# Patient Record
Sex: Female | Born: 1965 | Race: White | Hispanic: No | Marital: Married | Smoking: Never smoker
Health system: Southern US, Community
[De-identification: ages and names within clinical notes are randomized; demographics above are authoritative.]

## PROBLEM LIST (undated history)

## (undated) DIAGNOSIS — I10 Essential (primary) hypertension: Secondary | ICD-10-CM

## (undated) DIAGNOSIS — E785 Hyperlipidemia, unspecified: Secondary | ICD-10-CM

## (undated) DIAGNOSIS — E119 Type 2 diabetes mellitus without complications: Secondary | ICD-10-CM

## (undated) HISTORY — PX: BREAST CYST ASPIRATION: SHX578

---

## 1999-07-29 HISTORY — PX: BREAST EXCISIONAL BIOPSY: SUR124

## 2004-07-24 ENCOUNTER — Ambulatory Visit: Payer: Self-pay | Admitting: Obstetrics and Gynecology

## 2005-01-03 ENCOUNTER — Ambulatory Visit: Payer: Self-pay | Admitting: Obstetrics and Gynecology

## 2006-02-24 ENCOUNTER — Ambulatory Visit: Payer: Self-pay | Admitting: Obstetrics and Gynecology

## 2007-02-11 ENCOUNTER — Ambulatory Visit: Payer: Self-pay | Admitting: Obstetrics and Gynecology

## 2007-07-21 ENCOUNTER — Ambulatory Visit: Payer: Self-pay | Admitting: Gastroenterology

## 2007-08-04 ENCOUNTER — Other Ambulatory Visit: Payer: Self-pay

## 2007-08-04 ENCOUNTER — Ambulatory Visit: Payer: Self-pay | Admitting: General Surgery

## 2007-08-17 ENCOUNTER — Ambulatory Visit: Payer: Self-pay | Admitting: Obstetrics and Gynecology

## 2007-08-23 ENCOUNTER — Inpatient Hospital Stay: Payer: Self-pay | Admitting: Obstetrics and Gynecology

## 2008-05-18 ENCOUNTER — Ambulatory Visit: Payer: Self-pay | Admitting: Obstetrics and Gynecology

## 2009-07-16 ENCOUNTER — Ambulatory Visit: Payer: Self-pay | Admitting: Obstetrics and Gynecology

## 2010-02-04 ENCOUNTER — Inpatient Hospital Stay: Payer: Self-pay | Admitting: General Surgery

## 2010-02-05 LAB — PATHOLOGY REPORT

## 2010-08-08 ENCOUNTER — Ambulatory Visit: Payer: Self-pay | Admitting: Obstetrics and Gynecology

## 2011-08-25 ENCOUNTER — Ambulatory Visit: Payer: Self-pay | Admitting: Specialist

## 2011-08-28 ENCOUNTER — Ambulatory Visit: Payer: Self-pay | Admitting: Obstetrics and Gynecology

## 2012-03-30 ENCOUNTER — Ambulatory Visit: Payer: Self-pay | Admitting: Surgery

## 2012-08-31 ENCOUNTER — Ambulatory Visit: Payer: Self-pay | Admitting: Obstetrics and Gynecology

## 2012-09-17 ENCOUNTER — Ambulatory Visit: Payer: Self-pay | Admitting: Neurology

## 2013-09-08 ENCOUNTER — Ambulatory Visit: Payer: Self-pay | Admitting: Obstetrics and Gynecology

## 2014-09-26 ENCOUNTER — Ambulatory Visit: Payer: Self-pay | Admitting: Obstetrics and Gynecology

## 2015-09-06 ENCOUNTER — Other Ambulatory Visit: Payer: Self-pay | Admitting: Obstetrics and Gynecology

## 2015-09-06 DIAGNOSIS — Z1231 Encounter for screening mammogram for malignant neoplasm of breast: Secondary | ICD-10-CM

## 2015-09-14 DIAGNOSIS — E782 Mixed hyperlipidemia: Secondary | ICD-10-CM | POA: Insufficient documentation

## 2015-09-14 DIAGNOSIS — I10 Essential (primary) hypertension: Secondary | ICD-10-CM | POA: Insufficient documentation

## 2015-09-27 ENCOUNTER — Ambulatory Visit
Admission: RE | Admit: 2015-09-27 | Discharge: 2015-09-27 | Disposition: A | Discharge status: Home / Self Care | Payer: BLUE CROSS/BLUE SHIELD | Source: Ambulatory Visit | Attending: Obstetrics and Gynecology | Admitting: Obstetrics and Gynecology

## 2015-09-27 DIAGNOSIS — Z1231 Encounter for screening mammogram for malignant neoplasm of breast: Secondary | ICD-10-CM | POA: Diagnosis not present

## 2016-10-13 ENCOUNTER — Other Ambulatory Visit: Payer: Self-pay | Admitting: Obstetrics and Gynecology

## 2016-10-13 DIAGNOSIS — Z1231 Encounter for screening mammogram for malignant neoplasm of breast: Secondary | ICD-10-CM

## 2016-11-13 ENCOUNTER — Ambulatory Visit
Admission: RE | Admit: 2016-11-13 | Discharge: 2016-11-13 | Disposition: A | Discharge status: Home / Self Care | Payer: BLUE CROSS/BLUE SHIELD | Source: Ambulatory Visit | Attending: Obstetrics and Gynecology | Admitting: Obstetrics and Gynecology

## 2016-11-13 DIAGNOSIS — Z1231 Encounter for screening mammogram for malignant neoplasm of breast: Secondary | ICD-10-CM | POA: Diagnosis present

## 2017-01-27 ENCOUNTER — Other Ambulatory Visit: Payer: Self-pay | Admitting: Family Medicine

## 2017-01-27 DIAGNOSIS — M7989 Other specified soft tissue disorders: Secondary | ICD-10-CM

## 2017-01-29 ENCOUNTER — Ambulatory Visit
Admission: RE | Admit: 2017-01-29 | Discharge: 2017-01-29 | Disposition: A | Discharge status: Home / Self Care | Payer: BLUE CROSS/BLUE SHIELD | Source: Ambulatory Visit | Attending: Family Medicine | Admitting: Family Medicine

## 2017-01-29 DIAGNOSIS — M7989 Other specified soft tissue disorders: Secondary | ICD-10-CM

## 2017-02-04 ENCOUNTER — Ambulatory Visit: Payer: BLUE CROSS/BLUE SHIELD

## 2017-05-11 ENCOUNTER — Encounter: Payer: Self-pay | Admitting: Podiatry

## 2017-05-11 ENCOUNTER — Ambulatory Visit (INDEPENDENT_AMBULATORY_CARE_PROVIDER_SITE_OTHER): Payer: BLUE CROSS/BLUE SHIELD

## 2017-05-11 ENCOUNTER — Ambulatory Visit (INDEPENDENT_AMBULATORY_CARE_PROVIDER_SITE_OTHER): Payer: BLUE CROSS/BLUE SHIELD | Admitting: Podiatry

## 2017-05-11 VITALS — BP 155/98 | HR 78 | Resp 16

## 2017-05-11 DIAGNOSIS — M7752 Other enthesopathy of left foot: Secondary | ICD-10-CM | POA: Diagnosis not present

## 2017-05-11 MED ORDER — METHYLPREDNISOLONE 4 MG PO TBPK: ORAL_TABLET | ORAL | 0 refills | Status: DC

## 2017-05-11 MED ORDER — MELOXICAM 15 MG PO TABS: 15.0000 mg | ORAL_TABLET | Freq: Every day | ORAL | 3 refills | Status: DC

## 2017-05-11 NOTE — Progress Notes (Signed)
   Subjective:    Patient ID: Crystal Mann, female    DOB: Oct 04, 1965, 51 y.o.   MRN: [id number]  HPI: She presents today complaining of swelling to her left ankle 2 months. Stated that she had an ultrasound To make sure she didn't have a blood clot. Notices started when she was sitting at her desk at work. She denies any injury to the foot or ankle.    Review of Systems  HENT: Positive for sinus pressure and sneezing.   All other systems reviewed and are negative.      Objective:   Physical Exam: Vital signs are stable and oriented 3. Pulses are palpable. Neurologic sensorium is intact. Deep tendon reflexes are intact. Muscle strength is normal symmetrical bilateral. She has tenderness on inversion against resistance and on palpation of the posterior tibial tendon as it courses beneath the medial malleolus of the left foot. There is a boggy sensation on palpation in that area consistent with fluctuance and synovitis. Otherwise I see no edema in the foot. Radiographs taken today do not demonstrate any major osseous abnormalities accessory navicular's etc.        Assessment & Plan:  Posterior tibial tendinitis left.  Plan: Discussed appropriate shoe gear stretching exercises ice therapy.  Operative injection today to the area she declined.  Started her on a Medrol Dosepak to be followed by meloxicam.  Follow-up with her in 6 weeks.  Wrote her a note to allow her to wear tennis shoes to work.

## 2017-06-22 ENCOUNTER — Ambulatory Visit: Payer: BLUE CROSS/BLUE SHIELD | Admitting: Podiatry

## 2017-06-29 DIAGNOSIS — I872 Venous insufficiency (chronic) (peripheral): Secondary | ICD-10-CM | POA: Insufficient documentation

## 2017-10-13 ENCOUNTER — Other Ambulatory Visit: Payer: Self-pay | Admitting: Obstetrics and Gynecology

## 2017-10-13 DIAGNOSIS — Z1231 Encounter for screening mammogram for malignant neoplasm of breast: Secondary | ICD-10-CM

## 2017-11-17 ENCOUNTER — Ambulatory Visit
Admission: RE | Admit: 2017-11-17 | Discharge: 2017-11-17 | Disposition: A | Discharge status: Home / Self Care | Payer: BLUE CROSS/BLUE SHIELD | Source: Ambulatory Visit | Attending: Obstetrics and Gynecology | Admitting: Obstetrics and Gynecology

## 2017-11-17 DIAGNOSIS — Z1231 Encounter for screening mammogram for malignant neoplasm of breast: Secondary | ICD-10-CM | POA: Insufficient documentation

## 2017-11-17 DIAGNOSIS — R928 Other abnormal and inconclusive findings on diagnostic imaging of breast: Secondary | ICD-10-CM | POA: Insufficient documentation

## 2017-11-20 ENCOUNTER — Other Ambulatory Visit: Payer: Self-pay | Admitting: Obstetrics and Gynecology

## 2017-11-20 DIAGNOSIS — N6489 Other specified disorders of breast: Secondary | ICD-10-CM

## 2017-11-20 DIAGNOSIS — R928 Other abnormal and inconclusive findings on diagnostic imaging of breast: Secondary | ICD-10-CM

## 2017-12-01 ENCOUNTER — Ambulatory Visit
Admission: RE | Admit: 2017-12-01 | Discharge: 2017-12-01 | Disposition: A | Discharge status: Home / Self Care | Payer: BLUE CROSS/BLUE SHIELD | Source: Ambulatory Visit | Attending: Obstetrics and Gynecology | Admitting: Obstetrics and Gynecology

## 2017-12-01 DIAGNOSIS — R928 Other abnormal and inconclusive findings on diagnostic imaging of breast: Secondary | ICD-10-CM | POA: Diagnosis not present

## 2017-12-01 DIAGNOSIS — N6489 Other specified disorders of breast: Secondary | ICD-10-CM | POA: Diagnosis not present

## 2017-12-01 DIAGNOSIS — N6001 Solitary cyst of right breast: Secondary | ICD-10-CM | POA: Insufficient documentation

## 2018-11-17 DIAGNOSIS — I700 Atherosclerosis of aorta: Secondary | ICD-10-CM | POA: Insufficient documentation

## 2018-12-17 ENCOUNTER — Other Ambulatory Visit: Payer: Self-pay | Admitting: Obstetrics and Gynecology

## 2018-12-17 DIAGNOSIS — Z1231 Encounter for screening mammogram for malignant neoplasm of breast: Secondary | ICD-10-CM

## 2019-01-31 ENCOUNTER — Ambulatory Visit
Admission: RE | Admit: 2019-01-31 | Discharge: 2019-01-31 | Disposition: A | Discharge status: Home / Self Care | Payer: Managed Care, Other (non HMO) | Source: Ambulatory Visit | Attending: Obstetrics and Gynecology | Admitting: Obstetrics and Gynecology

## 2019-01-31 ENCOUNTER — Other Ambulatory Visit: Payer: Self-pay

## 2019-01-31 DIAGNOSIS — Z1231 Encounter for screening mammogram for malignant neoplasm of breast: Secondary | ICD-10-CM | POA: Insufficient documentation

## 2019-02-02 ENCOUNTER — Other Ambulatory Visit: Payer: Self-pay | Admitting: Obstetrics and Gynecology

## 2019-02-02 DIAGNOSIS — R928 Other abnormal and inconclusive findings on diagnostic imaging of breast: Secondary | ICD-10-CM

## 2019-02-02 DIAGNOSIS — N6310 Unspecified lump in the right breast, unspecified quadrant: Secondary | ICD-10-CM

## 2019-02-10 ENCOUNTER — Ambulatory Visit
Admission: RE | Admit: 2019-02-10 | Discharge: 2019-02-10 | Disposition: A | Discharge status: Home / Self Care | Payer: Managed Care, Other (non HMO) | Source: Ambulatory Visit | Attending: Obstetrics and Gynecology | Admitting: Obstetrics and Gynecology

## 2019-02-10 ENCOUNTER — Other Ambulatory Visit: Payer: Self-pay

## 2019-02-10 DIAGNOSIS — R928 Other abnormal and inconclusive findings on diagnostic imaging of breast: Secondary | ICD-10-CM

## 2019-02-10 DIAGNOSIS — N6310 Unspecified lump in the right breast, unspecified quadrant: Secondary | ICD-10-CM | POA: Diagnosis present

## 2019-02-14 ENCOUNTER — Other Ambulatory Visit: Payer: Self-pay | Admitting: Obstetrics and Gynecology

## 2019-02-14 DIAGNOSIS — R928 Other abnormal and inconclusive findings on diagnostic imaging of breast: Secondary | ICD-10-CM

## 2019-02-14 DIAGNOSIS — N6310 Unspecified lump in the right breast, unspecified quadrant: Secondary | ICD-10-CM

## 2019-02-18 ENCOUNTER — Ambulatory Visit
Admission: RE | Admit: 2019-02-18 | Discharge: 2019-02-18 | Disposition: A | Discharge status: Home / Self Care | Payer: Managed Care, Other (non HMO) | Source: Ambulatory Visit | Attending: Obstetrics and Gynecology | Admitting: Obstetrics and Gynecology

## 2019-02-18 ENCOUNTER — Other Ambulatory Visit: Payer: Self-pay

## 2019-02-18 ENCOUNTER — Other Ambulatory Visit: Payer: Self-pay | Admitting: Obstetrics and Gynecology

## 2019-02-18 DIAGNOSIS — R928 Other abnormal and inconclusive findings on diagnostic imaging of breast: Secondary | ICD-10-CM | POA: Insufficient documentation

## 2019-02-18 DIAGNOSIS — N6310 Unspecified lump in the right breast, unspecified quadrant: Secondary | ICD-10-CM

## 2019-02-18 HISTORY — PX: BREAST BIOPSY: SHX20

## 2019-02-21 LAB — SURGICAL PATHOLOGY

## 2019-05-24 ENCOUNTER — Other Ambulatory Visit: Payer: Self-pay | Admitting: Obstetrics and Gynecology

## 2019-05-24 DIAGNOSIS — N6002 Solitary cyst of left breast: Secondary | ICD-10-CM

## 2019-06-02 ENCOUNTER — Ambulatory Visit
Admission: RE | Admit: 2019-06-02 | Discharge: 2019-06-02 | Disposition: A | Discharge status: Home / Self Care | Payer: Managed Care, Other (non HMO) | Source: Ambulatory Visit | Attending: Obstetrics and Gynecology | Admitting: Obstetrics and Gynecology

## 2019-06-02 DIAGNOSIS — N6002 Solitary cyst of left breast: Secondary | ICD-10-CM | POA: Diagnosis present

## 2019-06-29 ENCOUNTER — Other Ambulatory Visit: Payer: Self-pay

## 2019-06-29 DIAGNOSIS — Z20822 Contact with and (suspected) exposure to covid-19: Secondary | ICD-10-CM

## 2019-07-01 LAB — NOVEL CORONAVIRUS, NAA: SARS-CoV-2, NAA: DETECTED — AB

## 2019-07-02 ENCOUNTER — Encounter: Payer: Self-pay | Admitting: Nurse Practitioner

## 2019-07-02 ENCOUNTER — Telehealth: Payer: Self-pay | Admitting: Nurse Practitioner

## 2019-07-02 DIAGNOSIS — E88819 Insulin resistance, unspecified: Secondary | ICD-10-CM | POA: Insufficient documentation

## 2019-07-02 DIAGNOSIS — E8881 Metabolic syndrome: Secondary | ICD-10-CM | POA: Insufficient documentation

## 2019-07-02 DIAGNOSIS — E669 Obesity, unspecified: Secondary | ICD-10-CM | POA: Insufficient documentation

## 2019-07-02 NOTE — Telephone Encounter (Signed)
  I connected by phone with Crystal Mann on 07/02/2019 at 1:47 PM to discuss the potential use of an new treatment for mild to moderate COVID-19 viral infection in non-hospitalized patients.  Symptoms started on Tuesday and continues to have fever, body aches, and diarrhea.  Tested positive 06/29/19.  This patient is a 53 y.o. female that meets the FDA criteria for Emergency Use Authorization of bamlanivimab:  Has a (+) direct SARS-CoV-2 viral test result  Has mild or moderate COVID-19   Is ? 54 years of age and weighs ? 40 kg  Is NOT hospitalized due to COVID-19  Is NOT requiring oxygen therapy or requiring an increase in baseline oxygen flow rate due to COVID-19  Is within 10 days of symptom onset  Has at least one of the high risk factor(s) for progression to severe COVID-19 and/or hospitalization as defined in EUA.  Specific high risk criteria : BMI >/= 35  Reviewed patient chronic problem list, which is currently managed by their PCP.  I have spoken and communicated the following to the patient or parent/caregiver:  1. FDA has authorized the emergency use of bamlanivimab for the treatment of mild to moderate COVID-19 in adults and pediatric patients with positive results of direct SARS-CoV-2 viral testing who are 72 years of age and older weighing at least 40 kg, and who are at high risk for progressing to severe COVID-19 and/or hospitalization.  2. The significant known and potential risks and benefits of bamlanivimab, and the extent to which such potential risks and benefits are unknown.  3. Information on available alternative treatments and the risks and benefits of those alternatives, including clinical trials.  4. Patients treated with bamlanivimab should continue to self-isolate and use infection control measures (e.g., wear mask, isolate, social distance, avoid sharing personal items, clean and disinfect "high touch" surfaces, and frequent handwashing) according to CDC  guidelines.   5. The patient or parent/caregiver has the option to accept or refuse bamlanivimab.  After reviewing this information with the patient, she wishes to think about this and would like call back tomorrow.  Barbaraann Faster CANNADY 07/02/2019 1:47 PM

## 2019-07-08 ENCOUNTER — Other Ambulatory Visit: Payer: Self-pay | Admitting: Obstetrics and Gynecology

## 2019-07-08 DIAGNOSIS — N6310 Unspecified lump in the right breast, unspecified quadrant: Secondary | ICD-10-CM

## 2019-08-22 ENCOUNTER — Ambulatory Visit
Admission: RE | Admit: 2019-08-22 | Discharge: 2019-08-22 | Disposition: A | Discharge status: Home / Self Care | Payer: Managed Care, Other (non HMO) | Source: Ambulatory Visit | Attending: Obstetrics and Gynecology | Admitting: Obstetrics and Gynecology

## 2019-08-22 DIAGNOSIS — N6310 Unspecified lump in the right breast, unspecified quadrant: Secondary | ICD-10-CM | POA: Diagnosis not present

## 2019-12-30 ENCOUNTER — Other Ambulatory Visit: Payer: Self-pay | Admitting: Obstetrics and Gynecology

## 2019-12-30 DIAGNOSIS — Z1231 Encounter for screening mammogram for malignant neoplasm of breast: Secondary | ICD-10-CM

## 2020-02-01 ENCOUNTER — Ambulatory Visit
Admission: RE | Admit: 2020-02-01 | Discharge: 2020-02-01 | Disposition: A | Discharge status: Home / Self Care | Payer: Managed Care, Other (non HMO) | Source: Ambulatory Visit | Attending: Obstetrics and Gynecology | Admitting: Obstetrics and Gynecology

## 2020-02-01 DIAGNOSIS — Z1231 Encounter for screening mammogram for malignant neoplasm of breast: Secondary | ICD-10-CM

## 2020-08-07 IMAGING — MG MM CLIP PLACEMENT
4 series · 4 of 12 positions shown · non-contrast
Comparison: Previous exam(s).

CLINICAL DATA: Evaluate biopsy marker

EXAM:
DIAGNOSTIC RIGHT MAMMOGRAM POST ULTRASOUND BIOPSY

[R LM synth-2D]
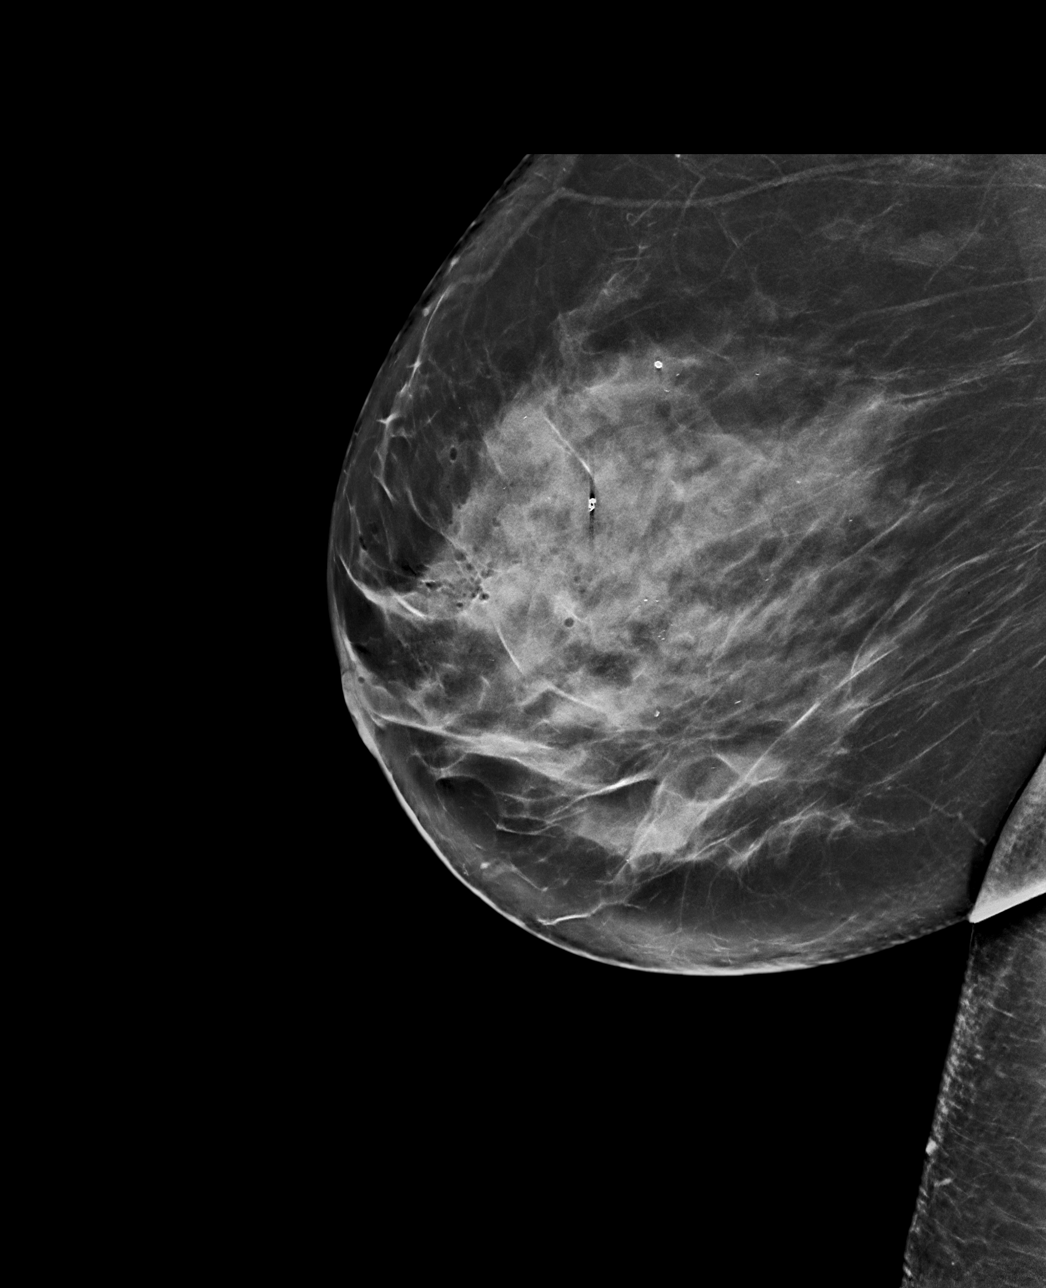

[R CC synth-2D]
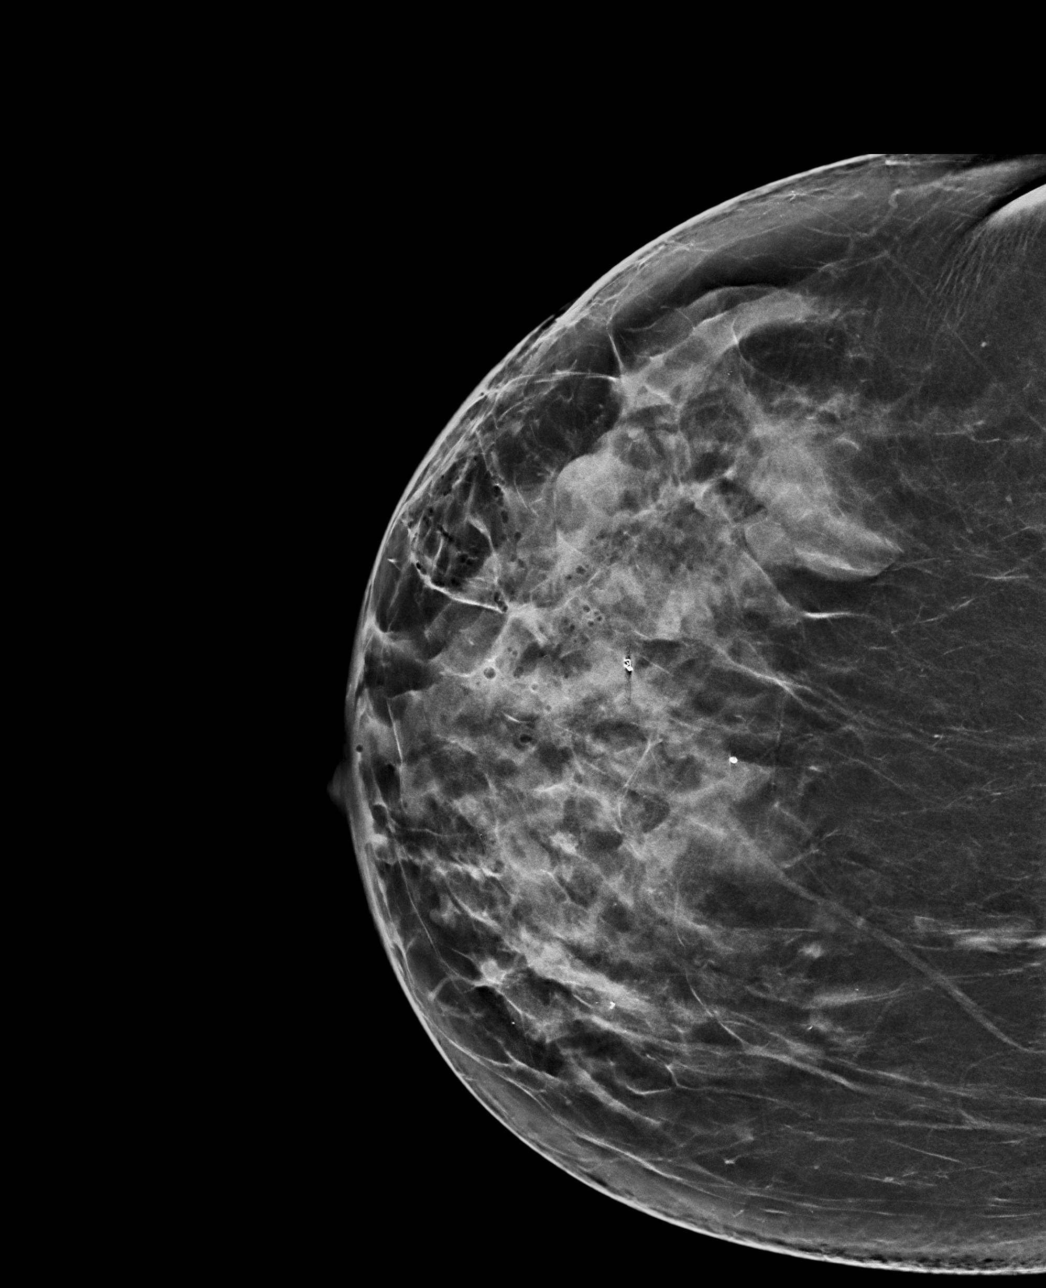

[R LM tomo · tomo slice 49/96.0]
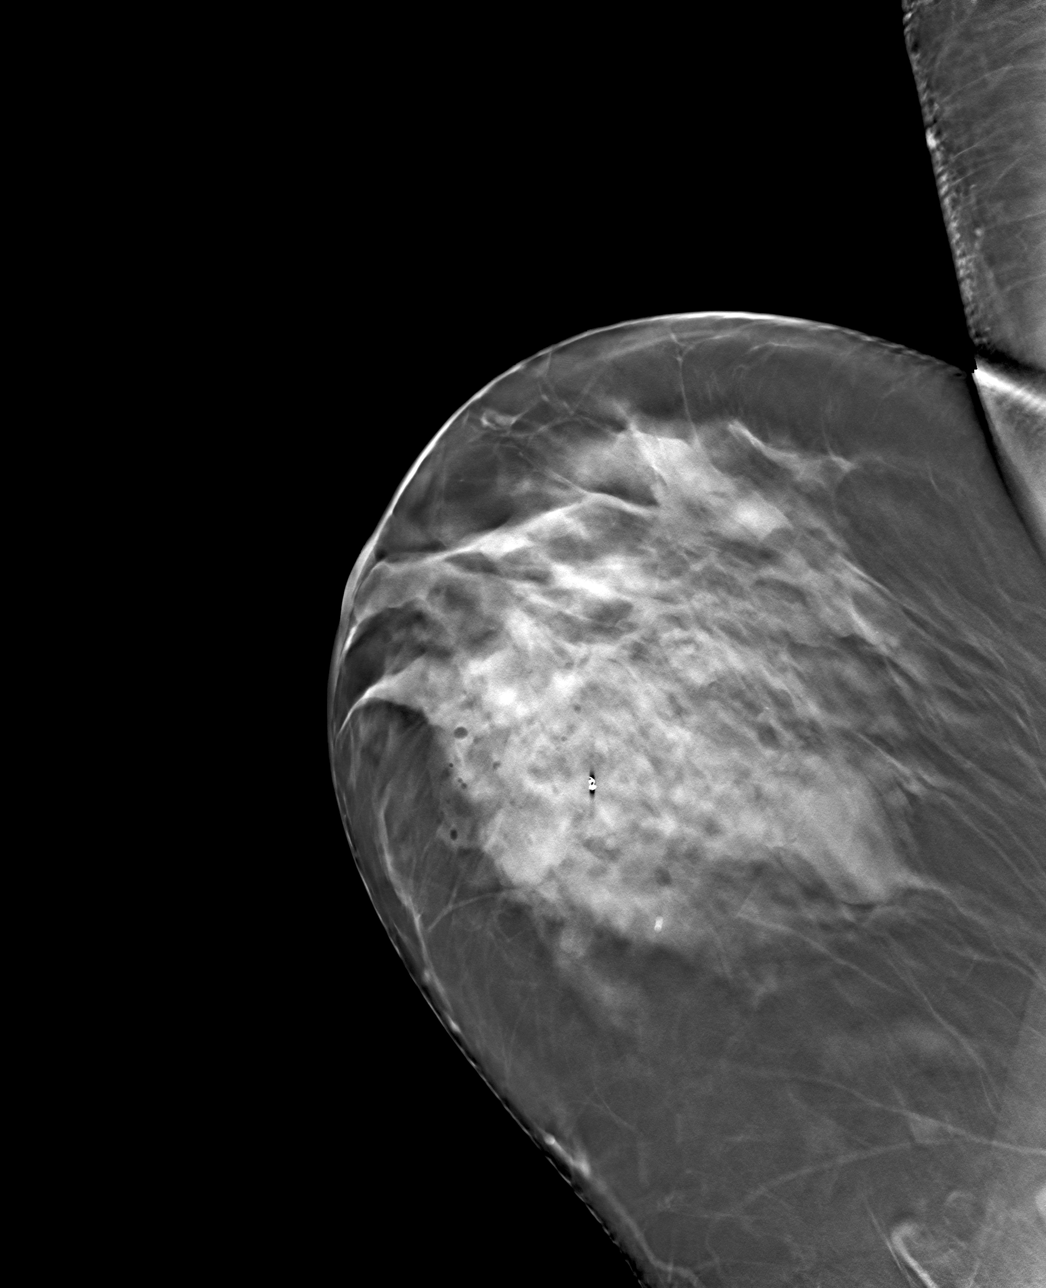

[R CC tomo · tomo slice 42/83.0]
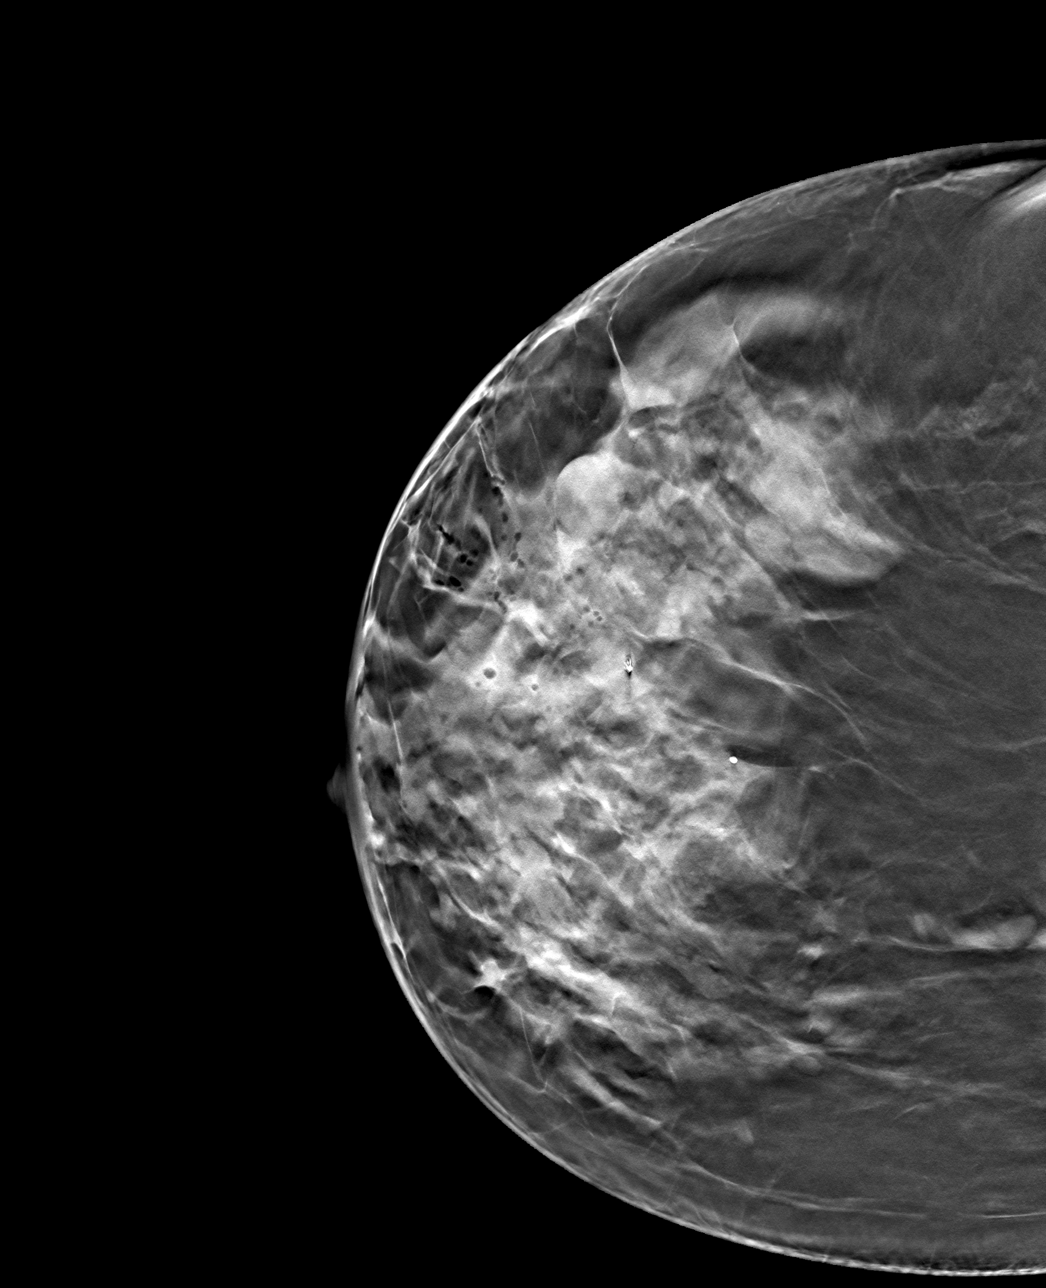

[4 of 12 positions shown; findings below may reference images not displayed]

FINDINGS: Mammographic images were obtained following ultrasound guided biopsy
of a right breast mass. The venous shaped biopsy marker is in the
expected location at 10 o'clock in the right breast. Of note, the
biopsied mass does not correlate with the mass identified at
screening mammography which is consistent with 1 of the many cysts
seen with ultrasound.
IMPRESSION: Appropriate clip placement as above.

Final Assessment: Post Procedure Mammograms for Marker Placement

## 2020-08-07 IMAGING — MG US BREAST BX W LOC DEV 1ST LESION IMG BX SPEC US GUIDE*R*
1 series · 8 of 8 positions shown · non-contrast
Comparison: Previous exam(s).
COMPARISON: Previous exam(s).

Addendum:
CLINICAL DATA: The patient presented for attempted aspiration of a
right breast mass at 10 o'clock. When the mass did not aspirate,
biopsy was performed.

EXAM:
ULTRASOUND GUIDED RIGHT BREAST CORE NEEDLE BIOPSY

[Series 1: MG view · 0.07mm/px · 8 of 18 slices shown]
[im 1/18]
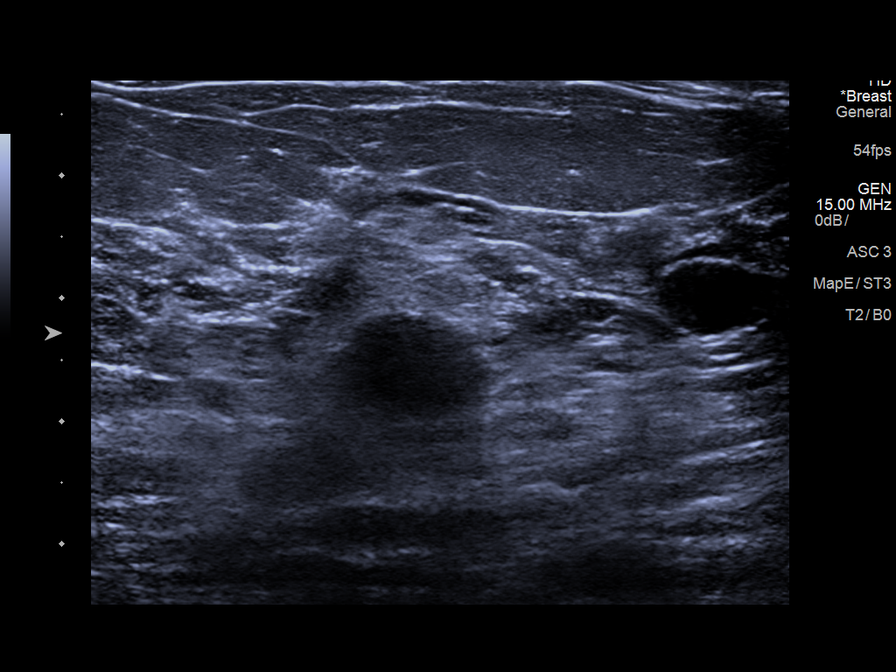
[im 3/18]
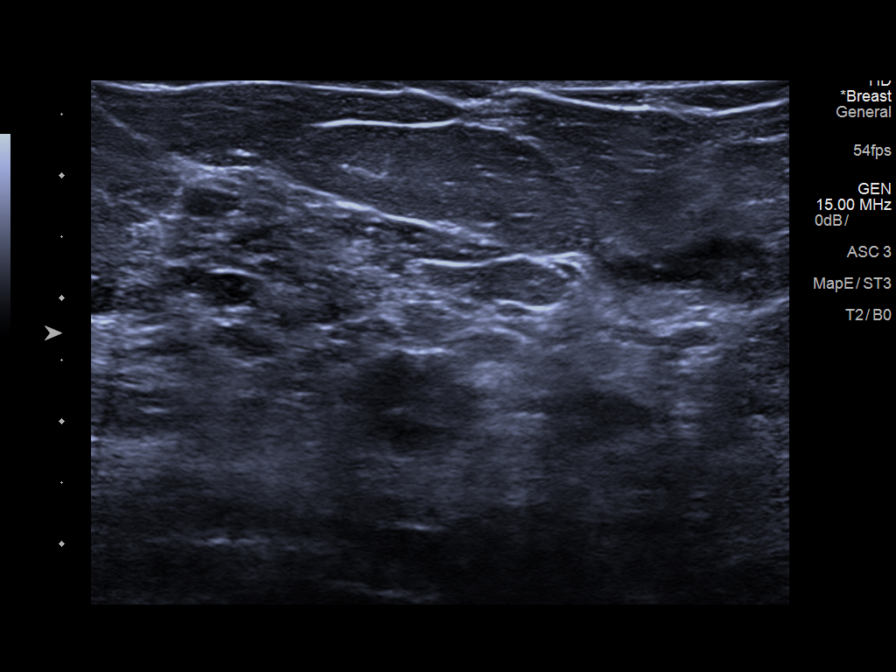
[im 5/18]
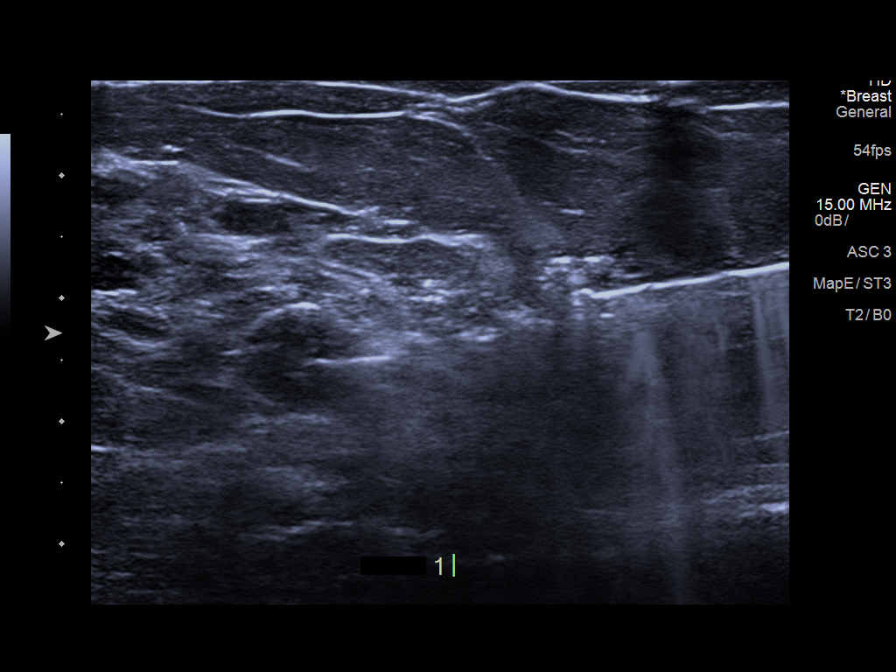
[im 8/18]
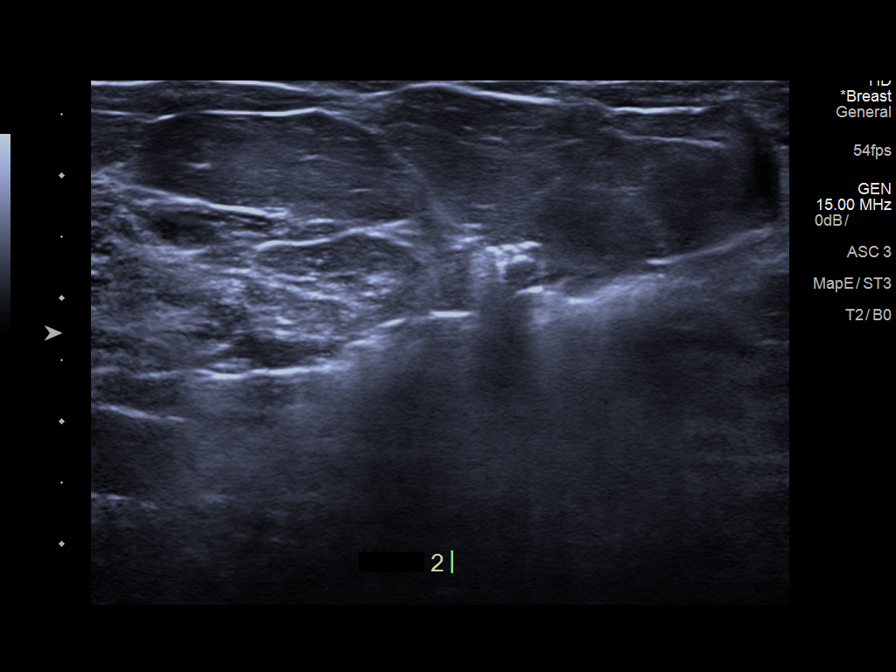
[im 10/18]
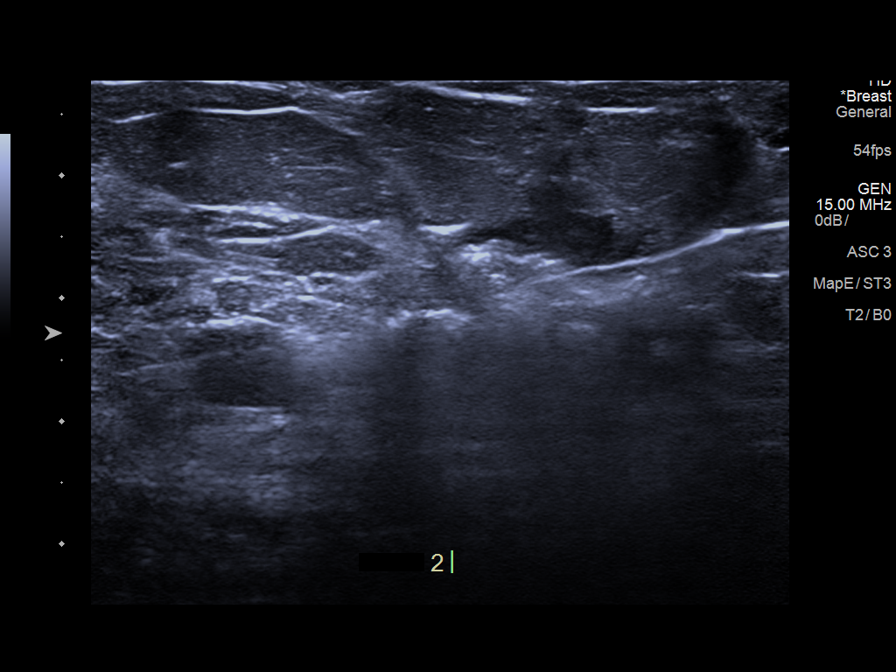
[im 13/18]
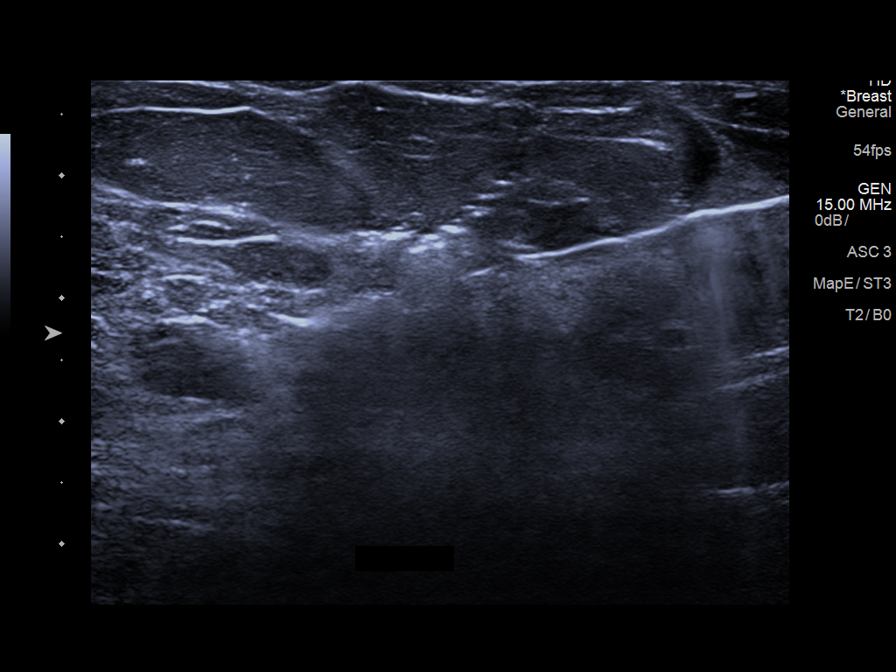
[im 15/18]
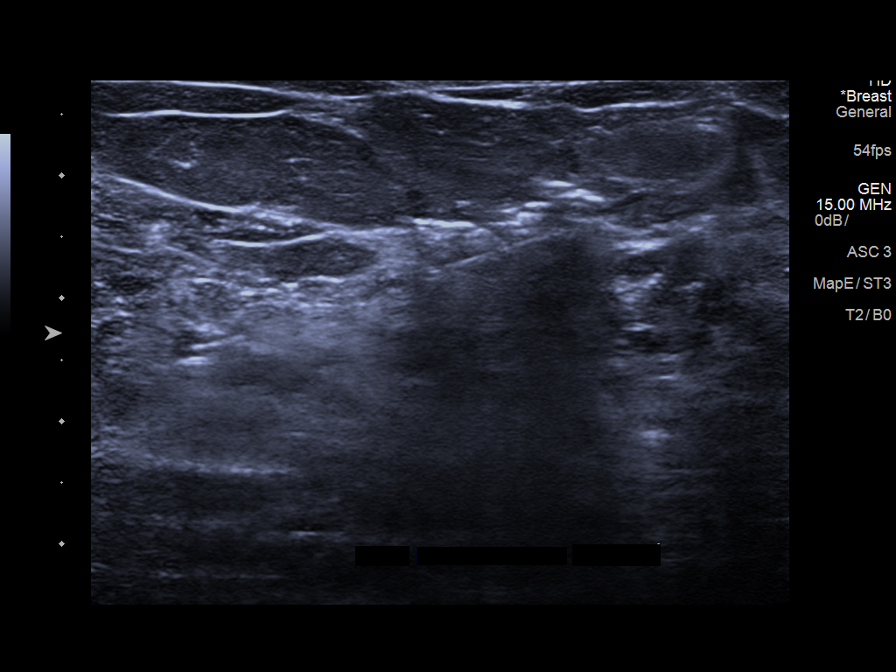
[im 18/18]
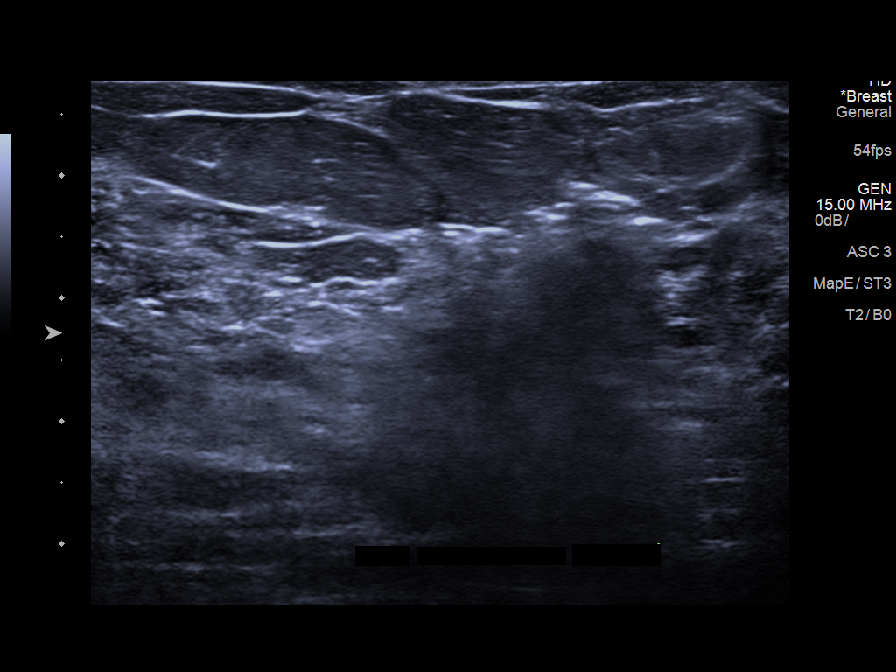

[8 of 8 positions shown; findings below may reference images not displayed]



Lesion quadrant: Upper-outer

Using sterile technique and 1% Lidocaine as local anesthetic, under
direct ultrasound visualization, a 12 gauge Bernardova device was
used to perform biopsy of a right breast mass at 10 o'clock using a
lateral approach. At the conclusion of the procedure Nomasibulele Mo[place] shaped
tissue marker clip was deployed into the biopsy cavity. Follow up 2
view mammogram was performed and dictated separately.
IMPRESSION: Ultrasound guided biopsy of a right breast mass. No apparent
complications.

ADDENDUM:
Pathology revealed BREAST, RIGHT- FIBROSIS AND FOAMY MACROPHAGES,
SEE COMMENT. - COLUMNAR CELL CHANGE. - NEGATIVE FOR ATYPIA AND
MALIGNANCY.

Concordant: Yes, per Dr. Ashwani Pund.

Pathology results were discussed with patient via telephone. The
patient reported doing well after the biopsy with tenderness at the
site. Post biopsy care instructions were reviewed and questions were
answered. The patient was encouraged to call [HOSPITAL]
for any additional concerns.

RECOMMENDATION: The patient was asked to return for diagnostic
mammography and possible ultrasound in 6 months.

Addendum by Riland Nowshad RN on 02/22/2019.



Lesion quadrant: Upper-outer

Using sterile technique and 1% Lidocaine as local anesthetic, under
direct ultrasound visualization, a 12 gauge Bernardova device was
used to perform biopsy of a right breast mass at 10 o'clock using a
lateral approach. At the conclusion of the procedure Nomasibulele [place] shaped
tissue marker clip was deployed into the biopsy cavity. Follow up 2
view mammogram was performed and dictated separately.
IMPRESSION: Ultrasound guided biopsy of a right breast mass. No apparent
complications.

## 2021-01-30 ENCOUNTER — Other Ambulatory Visit: Payer: Self-pay | Admitting: Obstetrics and Gynecology

## 2021-01-30 DIAGNOSIS — N6002 Solitary cyst of left breast: Secondary | ICD-10-CM

## 2021-02-04 ENCOUNTER — Ambulatory Visit
Admission: RE | Admit: 2021-02-04 | Discharge: 2021-02-04 | Disposition: A | Discharge status: Home / Self Care | Payer: Managed Care, Other (non HMO) | Source: Ambulatory Visit | Attending: Obstetrics and Gynecology | Admitting: Obstetrics and Gynecology

## 2021-02-04 ENCOUNTER — Other Ambulatory Visit: Payer: Self-pay

## 2021-02-04 DIAGNOSIS — N6002 Solitary cyst of left breast: Secondary | ICD-10-CM | POA: Diagnosis present

## 2022-01-31 ENCOUNTER — Other Ambulatory Visit: Payer: Self-pay | Admitting: Obstetrics and Gynecology

## 2022-01-31 DIAGNOSIS — Z1231 Encounter for screening mammogram for malignant neoplasm of breast: Secondary | ICD-10-CM

## 2022-02-19 ENCOUNTER — Ambulatory Visit
Admission: RE | Admit: 2022-02-19 | Discharge: 2022-02-19 | Disposition: A | Discharge status: Home / Self Care | Payer: Managed Care, Other (non HMO) | Source: Ambulatory Visit | Attending: Obstetrics and Gynecology | Admitting: Obstetrics and Gynecology

## 2022-02-19 DIAGNOSIS — Z1231 Encounter for screening mammogram for malignant neoplasm of breast: Secondary | ICD-10-CM | POA: Insufficient documentation

## 2022-11-18 ENCOUNTER — Other Ambulatory Visit: Payer: Self-pay | Admitting: Internal Medicine

## 2022-11-18 ENCOUNTER — Ambulatory Visit: Payer: Managed Care, Other (non HMO)

## 2022-11-18 DIAGNOSIS — Z83719 Family history of colon polyps, unspecified: Secondary | ICD-10-CM | POA: Diagnosis not present

## 2022-11-18 DIAGNOSIS — D125 Benign neoplasm of sigmoid colon: Secondary | ICD-10-CM | POA: Diagnosis not present

## 2022-11-18 DIAGNOSIS — Z1211 Encounter for screening for malignant neoplasm of colon: Secondary | ICD-10-CM | POA: Diagnosis present

## 2022-11-20 LAB — SURGICAL PATHOLOGY

## 2023-02-04 ENCOUNTER — Other Ambulatory Visit: Payer: Self-pay | Admitting: Obstetrics and Gynecology

## 2023-02-04 DIAGNOSIS — Z1231 Encounter for screening mammogram for malignant neoplasm of breast: Secondary | ICD-10-CM

## 2023-02-24 ENCOUNTER — Ambulatory Visit
Admission: RE | Admit: 2023-02-24 | Discharge: 2023-02-24 | Disposition: A | Discharge status: Home / Self Care | Payer: Managed Care, Other (non HMO) | Source: Ambulatory Visit | Attending: Obstetrics and Gynecology | Admitting: Obstetrics and Gynecology

## 2023-02-24 DIAGNOSIS — Z1231 Encounter for screening mammogram for malignant neoplasm of breast: Secondary | ICD-10-CM | POA: Diagnosis present

## 2023-07-14 ENCOUNTER — Encounter: Payer: Self-pay | Admitting: Podiatry

## 2023-07-14 ENCOUNTER — Ambulatory Visit: Payer: Managed Care, Other (non HMO) | Admitting: Podiatry

## 2023-07-14 DIAGNOSIS — M19072 Primary osteoarthritis, left ankle and foot: Secondary | ICD-10-CM | POA: Diagnosis not present

## 2023-07-14 NOTE — Progress Notes (Unsigned)
  Subjective:  Patient ID: Crystal Mann, female    DOB: [birth year],  MRN: [id number]  Chief Complaint  Patient presents with   Foot Pain    Pt is here due to knot on the top of her left foot, pt states no injury to foot, states knot has been there for a while, sometimes hurts more tender to the touch.    57 y.o. female presents with the above complaint.  Patient presents with left dorsal midfoot arthritis/capsulitis.  Patient states painful to touch is progressive gotten worse hurts with ambulation or shoe pressure is very tender to touch.  Especially when shoe rubs on it she has not seen and was prior to seeing me denies any other acute complaints feels 7 out of 10 dull aching nature.   Review of Systems: Negative except as noted in the HPI. Denies N/V/F/Ch.  No past medical history on file.  Current Outpatient Medications:    atenolol (TENORMIN) 100 MG tablet, Take 100 mg by mouth daily., Disp: , Rfl:    rosuvastatin (CRESTOR) 20 MG tablet, Take 20 mg by mouth daily., Disp: , Rfl:    SUMAtriptan (IMITREX) 50 MG tablet, Take 50 mg by mouth every 2 (two) hours as needed for migraine. May repeat in 2 hours if headache persists or recurs., Disp: , Rfl:   Social History   Tobacco Use  Smoking Status Never  Smokeless Tobacco Never    Allergies  Allergen Reactions   Ceftin [Cefuroxime] Rash   Doxycycline Rash    PT. Stated, "causes a rash."   Erythromycin Rash   Flagyl [Metronidazole] Rash   Sulfa Antibiotics Rash   Objective:  There were no vitals filed for this visit. There is no height or weight on file to calculate BMI. Constitutional Well developed. Well nourished.  Vascular Dorsalis pedis pulses palpable bilaterally. Posterior tibial pulses palpable bilaterally. Capillary refill normal to all digits.  No cyanosis or clubbing noted. Pedal hair growth normal.  Neurologic Normal speech. Oriented to person, place, and time. Epicritic sensation to light touch grossly  present bilaterally.  Dermatologic Nails well groomed and normal in appearance. No open wounds. No skin lesions.  Orthopedic: Pain on palpation left dorsal foot arthritic spurring noted to the dorsal dorsal first tarsometatarsal joint.  Pain on palpation.  No open wounds or lesion noted   Radiographs: None Assessment:   1. Arthritis of left midfoot    Plan:  Patient was evaluated and treated and all questions answered.  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  Patient agrees with plan we will pursue steroid injection. -A steroid injection was performed at left dorsal midfoot using 1% plain Lidocaine and 10 mg of Kenalog. This was well tolerated.    No follow-ups on file.

## 2023-08-25 ENCOUNTER — Ambulatory Visit: Payer: Managed Care, Other (non HMO) | Admitting: Podiatry

## 2023-08-25 ENCOUNTER — Encounter: Payer: Self-pay | Admitting: Podiatry

## 2023-08-25 DIAGNOSIS — M19072 Primary osteoarthritis, left ankle and foot: Secondary | ICD-10-CM | POA: Diagnosis not present

## 2023-08-25 NOTE — Progress Notes (Signed)
  Subjective:  Patient ID: Crystal Mann, female    DOB: 1966-03-03,  MRN: [id number]  Chief Complaint  Patient presents with   Foot Pain    Pt is here due to left foot pain, pt states the pain started back this past Saturday after walking 4 miles, states the pain is at the top of her foot.    58 y.o. female presents with the above complaint.  Patient presents with follow-up of left midfoot arthrosis capsulitis she says she is doing a lot better injection if she still some residual pain.  She would like to do another injection.  Review of Systems: Negative except as noted in the HPI. Denies N/V/F/Ch.  No past medical history on file.  Current Outpatient Medications:    atenolol (TENORMIN) 100 MG tablet, Take 100 mg by mouth daily., Disp: , Rfl:    rosuvastatin (CRESTOR) 20 MG tablet, Take 20 mg by mouth daily., Disp: , Rfl:    SUMAtriptan (IMITREX) 50 MG tablet, Take 50 mg by mouth every 2 (two) hours as needed for migraine. May repeat in 2 hours if headache persists or recurs., Disp: , Rfl:   Social History   Tobacco Use  Smoking Status Never  Smokeless Tobacco Never    Allergies  Allergen Reactions   Ceftin [Cefuroxime] Rash   Doxycycline Rash    PT. Stated, "causes a rash."   Erythromycin Rash   Flagyl [Metronidazole] Rash   Sulfa Antibiotics Rash   Objective:  There were no vitals filed for this visit. There is no height or weight on file to calculate BMI. Constitutional Well developed. Well nourished.  Vascular Dorsalis pedis pulses palpable bilaterally. Posterior tibial pulses palpable bilaterally. Capillary refill normal to all digits.  No cyanosis or clubbing noted. Pedal hair growth normal.  Neurologic Normal speech. Oriented to person, place, and time. Epicritic sensation to light touch grossly present bilaterally.  Dermatologic Nails well groomed and normal in appearance. No open wounds. No skin lesions.  Orthopedic: Pain on palpation left dorsal  foot arthritic spurring noted to the dorsal dorsal first tarsometatarsal joint.  Pain on palpation.  No open wounds or lesion noted   Radiographs: None Assessment:   1. Arthritis of left midfoot     Plan:  Patient was evaluated and treated and all questions answered.  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  Patient agrees with plan we will pursue steroid injection. -A second steroid injection was performed at left dorsal midfoot using 1% plain Lidocaine and 10 mg of Kenalog. This was well tolerated.    No follow-ups on file.

## 2023-09-02 ENCOUNTER — Ambulatory Visit: Payer: Managed Care, Other (non HMO) | Admitting: Dermatology

## 2023-09-02 ENCOUNTER — Encounter: Payer: Self-pay | Admitting: Dermatology

## 2023-09-02 ENCOUNTER — Other Ambulatory Visit: Payer: Self-pay | Admitting: Dermatology

## 2023-09-02 DIAGNOSIS — L918 Other hypertrophic disorders of the skin: Secondary | ICD-10-CM | POA: Diagnosis not present

## 2023-09-02 DIAGNOSIS — D229 Melanocytic nevi, unspecified: Secondary | ICD-10-CM

## 2023-09-02 DIAGNOSIS — D492 Neoplasm of unspecified behavior of bone, soft tissue, and skin: Secondary | ICD-10-CM

## 2023-09-02 NOTE — Progress Notes (Signed)
   New Patient Visit   Subjective  Crystal Mann is a 58 y.o. female who presents for the following: skin tag R inframammary, has had tag for yrs but started draining yesterday, painful The patient has spots, moles and lesions to be evaluated, some may be new or changing and the patient may have concern these could be cancer.   The following portions of the chart were reviewed this encounter and updated as appropriate: medications, allergies, medical history  Review of Systems:  No other skin or systemic complaints except as noted in HPI or Assessment and Plan.  Objective  Well appearing patient in no apparent distress; mood and affect are within normal limits.   A focused examination was performed of the following areas: chest  Relevant exam findings are noted in the Assessment and Plan.  R upper abdomen 6.39mm flesh pink brown pap with crusting  Assessment & Plan   MELANOCYTIC NEVI Exam: Tan-brown and/or pink-flesh-colored symmetric macules and papules  Treatment Plan: Benign appearing on exam today. Recommend observation. Call clinic for new or changing moles. Recommend daily use of broad spectrum spf 30+ sunscreen to sun-exposed areas.    Acrochordons (Skin Tags) - Fleshy, skin-colored pedunculated papules - Benign appearing.  - Observe. - If desired, they can be removed with an in office procedure that is not covered by insurance. - Please call the clinic if you notice any new or changing lesions.   NEOPLASM OF SKIN R upper abdomen Epidermal / dermal shaving  Lesion diameter (cm):  0.6 Informed consent: discussed and consent obtained   Patient was prepped and draped in usual sterile fashion: area prepped with alcohol. Anesthesia: the lesion was anesthetized in a standard fashion   Anesthetic:  1% lidocaine w/ epinephrine 1-100,000 buffered w/ 8.4% NaHCO3 Instrument used: flexible razor blade   Hemostasis achieved with: pressure, aluminum chloride and  electrodesiccation   Outcome: patient tolerated procedure well   Post-procedure details: wound care instructions given   Post-procedure details comment:  Ointment and small bandage applied  Anatomic Pathology Report  Return for as scheduled.  I, Grayce Saunas, RMA, am acting as scribe for Rexene Rattler, MD .   Documentation: I have reviewed the above documentation for accuracy and completeness, and I agree with the above.  Rexene Rattler, MD

## 2023-09-02 NOTE — Patient Instructions (Addendum)
 Wound Care Instructions  Cleanse wound gently with soap and water once a day then pat dry with clean gauze. Apply a thin coat of Petrolatum (petroleum jelly, Vaseline) over the wound (unless you have an allergy to this). We recommend that you use a new, sterile tube of Vaseline. Do not pick or remove scabs. Do not remove the yellow or white healing tissue from the base of the wound.  Cover the wound with fresh, clean, nonstick gauze and secure with paper tape. You may use Band-Aids in place of gauze and tape if the wound is small enough, but would recommend trimming much of the tape off as there is often too much. Sometimes Band-Aids can irritate the skin.  You should call the office for your biopsy report after 1 week if you have not already been contacted.  If you experience any problems, such as abnormal amounts of bleeding, swelling, significant bruising, significant pain, or evidence of infection, please call the office immediately.  FOR ADULT SURGERY PATIENTS: If you need something for pain relief you may take 1 extra strength Tylenol  (acetaminophen ) AND 2 Ibuprofen (200mg  each) together every 4 hours as needed for pain. (do not take these if you are allergic to them or if you have a reason you should not take them.) Typically, you may only need pain medication for 1 to 3 days.   Skin tag removal generally is considered cosmetic and not covered by insurance.  Cosmetic removal fee is $115 for up to 15 tags removed. You can contact your insurance to see if skin tag removal is a medically necessary covered benefit.  Even if covered, copay and deductible payments would apply. CPT code: 88799 Diagnosis code: L91.8   .skint  Due to recent changes in healthcare laws, you may see results of your pathology and/or laboratory studies on MyChart before the doctors have had a chance to review them. We understand that in some cases there may be results that are confusing or concerning to you. Please  understand that not all results are received at the same time and often the doctors may need to interpret multiple results in order to provide you with the best plan of care or course of treatment. Therefore, we ask that you please give us  2 business days to thoroughly review all your results before contacting the office for clarification. Should we see a critical lab result, you will be contacted sooner.   If You Need Anything After Your Visit  If you have any questions or concerns for your doctor, please call our main line at [phone] and press option 4 to reach your doctor's medical assistant. If no one answers, please leave a voicemail as directed and we will return your call as soon as possible. Messages left after 4 pm will be answered the following business day.   You may also send us  a message via MyChart. We typically respond to MyChart messages within 1-2 business days.  For prescription refills, please ask your pharmacy to contact our office. Our fax number is [phone].  If you have an urgent issue when the clinic is closed that cannot wait until the next business day, you can page your doctor at the number below.    Please note that while we do our best to be available for urgent issues outside of office hours, we are not available 24/7.   If you have an urgent issue and are unable to reach us , you may choose to seek medical care at  your doctor's office, retail clinic, urgent care center, or emergency room.  If you have a medical emergency, please immediately call 911 or go to the emergency department.  Pager Numbers  - Dr. Hester: [phone]  - Dr. Jackquline: [phone]  - Dr. Claudene: [phone]   In the event of inclement weather, please call our main line at [phone] for an update on the status of any delays or closures.  Dermatology Medication Tips: Please keep the boxes that topical medications come in in order to help keep track of the instructions  about where and how to use these. Pharmacies typically print the medication instructions only on the boxes and not directly on the medication tubes.   If your medication is too expensive, please contact our office at [phone] option 4 or send us  a message through MyChart.   We are unable to tell what your co-pay for medications will be in advance as this is different depending on your insurance coverage. However, we may be able to find a substitute medication at lower cost or fill out paperwork to get insurance to cover a needed medication.   If a prior authorization is required to get your medication covered by your insurance company, please allow us  1-2 business days to complete this process.  Drug prices often vary depending on where the prescription is filled and some pharmacies may offer cheaper prices.  The website www.goodrx.com contains coupons for medications through different pharmacies. The prices here do not account for what the cost may be with help from insurance (it may be cheaper with your insurance), but the website can give you the price if you did not use any insurance.  - You can print the associated coupon and take it with your prescription to the pharmacy.  - You may also stop by our office during regular business hours and pick up a GoodRx coupon card.  - If you need your prescription sent electronically to a different pharmacy, notify our office through [facility] or by phone at [phone] option 4.     Si Usted Necesita Algo Despus de Su Visita  Tambin puede enviarnos un mensaje a travs de Clinical Cytogeneticist. Por lo general respondemos a los mensajes de MyChart en el transcurso de 1 a 2 das hbiles.  Para renovar recetas, por favor pida a su farmacia que se ponga en contacto con nuestra oficina. Randi lakes de fax es [place] [phone].  Si tiene un asunto urgente cuando la clnica est cerrada y que no puede esperar hasta el siguiente da hbil, puede  llamar/localizar a su doctor(a) al nmero que aparece a continuacin.   Por favor, tenga en cuenta que aunque hacemos todo lo posible para estar disponibles para asuntos urgentes fuera del horario de [place], no estamos disponibles las 24 horas del da, los 7 [address]  [address] de la [place].   Si tiene un problema urgente y no puede comunicarse con nosotros, puede optar por buscar atencin mdica  en el consultorio de su doctor(a), en una clnica privada, en un centro de atencin urgente o en una sala de emergencias.  Si tiene engineer, drilling, por favor llame inmediatamente al 911 o vaya a la sala de emergencias.  Nmeros de bper  - Dr. Hester: [phone]  - Dra. Jackquline: [phone]  - Dr. Claudene: [phone]   En caso de inclemencias del tiempo, por favor llame a landry capes principal al [phone] para una actualizacin sobre el [place] de cualquier retraso o cierre.  Consejos para la medicacin  en dermatologa: Por favor, guarde las cajas en las que vienen los medicamentos de uso tpico para ayudarle a seguir las instrucciones sobre dnde y cmo usarlos. Las farmacias generalmente imprimen las instrucciones del medicamento slo en las cajas y no directamente en los tubos del [place].   Si su medicamento es muy caro, por favor, pngase en contacto con landry rieger llamando al [phone] y presione la opcin 4 o envenos un mensaje a travs de Clinical Cytogeneticist.   No podemos decirle cul ser su copago por los medicamentos por adelantado ya que esto es diferente dependiendo de la cobertura de su seguro. Sin embargo, es posible que podamos encontrar un medicamento sustituto a audiological scientist un formulario para que el seguro cubra el medicamento que se considera necesario.   Si se requiere una autorizacin previa para que su compaa de seguros cubra su medicamento, por favor permtanos de 1 a 2 das hbiles para completar este proceso.  Los precios de los medicamentos varan con  frecuencia dependiendo del environmental consultant de dnde se surte la receta y alguna farmacias pueden ofrecer precios ms baratos.  El sitio web www.goodrx.com tiene cupones para medicamentos de health and safety inspector. Los precios aqu no tienen en cuenta lo que podra costar con la ayuda del seguro (puede ser ms barato con su seguro), pero el sitio web puede darle el precio si no utiliz tourist information centre manager.  - Puede imprimir el cupn correspondiente y llevarlo con su receta a la farmacia.  - Tambin puede pasar por nuestra oficina durante el horario de atencin regular y education officer, museum una tarjeta de cupones de GoodRx.  - Si necesita que su receta se enve electrnicamente a una farmacia diferente, informe a nuestra oficina a travs de MyChart de [place] o por telfono llamando al [phone] y presione la opcin 4.

## 2023-09-04 ENCOUNTER — Encounter: Payer: Self-pay | Admitting: Dermatology

## 2023-09-04 LAB — ANATOMIC PATHOLOGY REPORT

## 2023-09-07 ENCOUNTER — Telehealth: Payer: Self-pay

## 2023-09-07 NOTE — Telephone Encounter (Signed)
 Advised patient biopsy of the right upper abdomen was a benign irritated skin tag.

## 2023-09-07 NOTE — Telephone Encounter (Signed)
-----   Message from Artemio Larry sent at 09/07/2023 12:45 PM EST ----- right upper abdomen ,Skin Biopsy: CONSISTENT WITH  TRAUMATIZED FIBROEPITHELIAL POLYP ([place])   Benign irritated skin tag - please call patient

## 2023-10-15 ENCOUNTER — Ambulatory Visit: Payer: Managed Care, Other (non HMO) | Admitting: Dermatology

## 2023-11-16 ENCOUNTER — Encounter: Payer: Self-pay | Admitting: Dermatology

## 2023-11-16 ENCOUNTER — Ambulatory Visit: Admitting: Dermatology

## 2023-11-16 DIAGNOSIS — D1801 Hemangioma of skin and subcutaneous tissue: Secondary | ICD-10-CM

## 2023-11-16 DIAGNOSIS — L578 Other skin changes due to chronic exposure to nonionizing radiation: Secondary | ICD-10-CM

## 2023-11-16 DIAGNOSIS — L814 Other melanin hyperpigmentation: Secondary | ICD-10-CM | POA: Diagnosis not present

## 2023-11-16 DIAGNOSIS — Z1283 Encounter for screening for malignant neoplasm of skin: Secondary | ICD-10-CM | POA: Diagnosis not present

## 2023-11-16 DIAGNOSIS — L821 Other seborrheic keratosis: Secondary | ICD-10-CM

## 2023-11-16 DIAGNOSIS — Z7189 Other specified counseling: Secondary | ICD-10-CM

## 2023-11-16 DIAGNOSIS — D2362 Other benign neoplasm of skin of left upper limb, including shoulder: Secondary | ICD-10-CM

## 2023-11-16 DIAGNOSIS — D229 Melanocytic nevi, unspecified: Secondary | ICD-10-CM

## 2023-11-16 DIAGNOSIS — L820 Inflamed seborrheic keratosis: Secondary | ICD-10-CM

## 2023-11-16 DIAGNOSIS — W908XXA Exposure to other nonionizing radiation, initial encounter: Secondary | ICD-10-CM | POA: Diagnosis not present

## 2023-11-16 DIAGNOSIS — L719 Rosacea, unspecified: Secondary | ICD-10-CM

## 2023-11-16 DIAGNOSIS — D239 Other benign neoplasm of skin, unspecified: Secondary | ICD-10-CM

## 2023-11-16 DIAGNOSIS — L918 Other hypertrophic disorders of the skin: Secondary | ICD-10-CM

## 2023-11-16 NOTE — Patient Instructions (Addendum)

## 2023-11-16 NOTE — Progress Notes (Signed)
 Follow-Up Visit   Subjective  Crystal Mann is a 58 y.o. female who presents for the following: Skin Cancer Screening and Full Body Skin Exam No history of skin cancer.  Hx bx proven  irritated skin tag at right upper abdomen   The patient presents for Total-Body Skin Exam (TBSE) for skin cancer screening and mole check. The patient has spots, moles and lesions to be evaluated, some may be new or changing and the patient may have concern these could be cancer.  The following portions of the chart were reviewed this encounter and updated as appropriate: medications, allergies, medical history  Review of Systems:  No other skin or systemic complaints except as noted in HPI or Assessment and Plan.  Objective  Well appearing patient in no apparent distress; mood and affect are within normal limits.  A full examination was performed including scalp, head, eyes, ears, nose, lips, neck, chest, axillae, abdomen, back, buttocks, bilateral upper extremities, bilateral lower extremities, hands, feet, fingers, toes, fingernails, and toenails. All findings within normal limits unless otherwise noted below.   Relevant physical exam findings are noted in the Assessment and Plan.  neck x 4 ,  intermammary x 1 (5) Erythematous stuck-on, waxy papule or plaque  Assessment & Plan   SKIN CANCER SCREENING PERFORMED TODAY.  ACTINIC DAMAGE - Chronic condition, secondary to cumulative UV/sun exposure - diffuse scaly erythematous macules with underlying dyspigmentation - Recommend daily broad spectrum sunscreen SPF 30+ to sun-exposed areas, reapply every 2 hours as needed.  - Staying in the shade or wearing long sleeves, sun glasses (UVA+UVB protection) and wide brim hats (4-inch brim around the entire circumference of the hat) are also recommended for sun protection.  - Call for new or changing lesions.  LENTIGINES, SEBORRHEIC KERATOSES, HEMANGIOMAS - Benign normal skin lesions -  Benign-appearing - Call for any changes Sks - neck, arms  DERMATOFIBROMA Exam: Firm pink/brown papulenodule with dimple sign at left posterior shoulder Treatment Plan: A dermatofibroma is a benign growth possibly related to trauma, such as an insect bite, cut from shaving, or inflamed acne-type bump.  Treatment options to remove include shave or excision with resulting scar and risk of recurrence.  Since benign-appearing and not bothersome, will observe for now.   MELANOCYTIC NEVI - Tan-brown and/or pink-flesh-colored symmetric macules and papules - Benign appearing on exam today - Observation - Call clinic for new or changing moles - Recommend daily use of broad spectrum spf 30+ sunscreen to sun-exposed areas.   Acrochordons (Skin Tags). At inner thighs  - Fleshy, skin-colored pedunculated papules - Benign appearing.  - Observe. - If desired, they can be removed with an in office procedure that is not covered by insurance. - Please call the clinic if you notice any new or changing lesions.   ROSACEA Exam Pinkness at cheeks, nose and chin  Chronic and persistent condition with duration or expected duration over one year. Condition is symptomatic / bothersome to patient. Not to goal. Rosacea is a chronic progressive skin condition usually affecting the face of adults, causing redness and/or acne bumps. It is treatable but not curable. It sometimes affects the eyes (ocular rosacea) as well. It may respond to topical and/or systemic medication and can flare with stress, sun exposure, alcohol, exercise, topical steroids (including hydrocortisone/cortisone 10) and some foods.  Daily application of broad spectrum spf 30+ sunscreen to face is recommended to reduce flares. Patient denies grittiness of the eyes  Treatment Plan Counseling for BBL / IPL /  Laser and Coordination of Care Discussed the treatment option of Broad Band Light (BBL) /Intense Pulsed Light (IPL)/ Laser for skin  discoloration, including brown spots and redness.  Typically we recommend at least 1-3 treatment sessions about 5-8 weeks apart for best results.  Cannot have tanned skin when BBL performed, and regular use of sunscreen/photoprotection is advised after the procedure to help maintain results. The patient's condition may also require "maintenance treatments" in the future.  The fee for BBL / laser treatments is $350 per treatment session for the whole face.  A fee can be quoted for other parts of the body.  Insurance typically does not pay for BBL/laser treatments and therefore the fee is an out-of-pocket cost. Recommend prophylactic valtrex treatment. Once scheduled for procedure, will send Rx in prior to patient's appointment.  Pt declines treatment at this time.   INFLAMED SEBORRHEIC KERATOSIS (5) neck x 4 ,  intermammary x 1 (5) Symptomatic, irritating, patient would like treated. Destruction of lesion - neck x 4 ,  intermammary x 1 (5) Complexity: simple   Destruction method: cryotherapy   Informed consent: discussed and consent obtained   Timeout:  patient name, date of birth, surgical site, and procedure verified Lesion destroyed using liquid nitrogen: Yes   Region frozen until ice ball extended beyond lesion: Yes   Outcome: patient tolerated procedure well with no complications   Post-procedure details: wound care instructions given   SKIN CANCER SCREENING   ACTINIC SKIN DAMAGE   LENTIGO   MELANOCYTIC NEVUS, UNSPECIFIED LOCATION   ROSACEA   COUNSELING AND COORDINATION OF CARE   SKIN TAGS, MULTIPLE ACQUIRED   DERMATOFIBROMA   Return for 2 years tbse.  IRandee Busing, CMA, am acting as scribe for Celine Collard, MD.   Documentation: I have reviewed the above documentation for accuracy and completeness, and I agree with the above.  Celine Collard, MD

## 2024-02-08 ENCOUNTER — Other Ambulatory Visit: Payer: Self-pay | Admitting: Obstetrics and Gynecology

## 2024-02-08 DIAGNOSIS — Z1231 Encounter for screening mammogram for malignant neoplasm of breast: Secondary | ICD-10-CM

## 2024-03-03 ENCOUNTER — Ambulatory Visit
Admission: RE | Admit: 2024-03-03 | Discharge: 2024-03-03 | Disposition: A | Discharge status: Home / Self Care | Source: Ambulatory Visit | Attending: Obstetrics and Gynecology | Admitting: Obstetrics and Gynecology

## 2024-03-03 DIAGNOSIS — Z1231 Encounter for screening mammogram for malignant neoplasm of breast: Secondary | ICD-10-CM | POA: Insufficient documentation

## 2024-04-29 ENCOUNTER — Ambulatory Visit (INDEPENDENT_AMBULATORY_CARE_PROVIDER_SITE_OTHER)

## 2024-04-29 DIAGNOSIS — K317 Polyp of stomach and duodenum: Secondary | ICD-10-CM | POA: Diagnosis present

## 2024-04-29 DIAGNOSIS — K2970 Gastritis, unspecified, without bleeding: Secondary | ICD-10-CM | POA: Diagnosis not present

## 2024-06-09 ENCOUNTER — Emergency Department

## 2024-06-09 ENCOUNTER — Inpatient Hospital Stay
Admission: EM | Admit: 2024-06-09 | Discharge: 2024-06-11 | DRG: 641 | Disposition: A | Discharge status: Home / Self Care | Source: Other Acute Inpatient Hospital

## 2024-06-09 ENCOUNTER — Other Ambulatory Visit: Payer: Self-pay

## 2024-06-09 DIAGNOSIS — Z6835 Body mass index (BMI) 35.0-35.9, adult: Secondary | ICD-10-CM

## 2024-06-09 DIAGNOSIS — E876 Hypokalemia: Secondary | ICD-10-CM | POA: Diagnosis not present

## 2024-06-09 DIAGNOSIS — I10 Essential (primary) hypertension: Secondary | ICD-10-CM | POA: Diagnosis present

## 2024-06-09 DIAGNOSIS — R197 Diarrhea, unspecified: Secondary | ICD-10-CM | POA: Diagnosis present

## 2024-06-09 DIAGNOSIS — Z79899 Other long term (current) drug therapy: Secondary | ICD-10-CM

## 2024-06-09 DIAGNOSIS — R319 Hematuria, unspecified: Secondary | ICD-10-CM | POA: Diagnosis present

## 2024-06-09 DIAGNOSIS — Z1152 Encounter for screening for COVID-19: Secondary | ICD-10-CM

## 2024-06-09 DIAGNOSIS — T50995A Adverse effect of other drugs, medicaments and biological substances, initial encounter: Secondary | ICD-10-CM | POA: Diagnosis present

## 2024-06-09 DIAGNOSIS — N179 Acute kidney failure, unspecified: Principal | ICD-10-CM | POA: Diagnosis present

## 2024-06-09 DIAGNOSIS — K760 Fatty (change of) liver, not elsewhere classified: Secondary | ICD-10-CM | POA: Diagnosis present

## 2024-06-09 DIAGNOSIS — Z7984 Long term (current) use of oral hypoglycemic drugs: Secondary | ICD-10-CM

## 2024-06-09 DIAGNOSIS — E669 Obesity, unspecified: Secondary | ICD-10-CM | POA: Diagnosis present

## 2024-06-09 DIAGNOSIS — E782 Mixed hyperlipidemia: Secondary | ICD-10-CM | POA: Diagnosis present

## 2024-06-09 DIAGNOSIS — R110 Nausea: Secondary | ICD-10-CM | POA: Insufficient documentation

## 2024-06-09 DIAGNOSIS — E119 Type 2 diabetes mellitus without complications: Secondary | ICD-10-CM | POA: Diagnosis present

## 2024-06-09 HISTORY — DX: Type 2 diabetes mellitus without complications: E11.9

## 2024-06-09 HISTORY — DX: Essential (primary) hypertension: I10

## 2024-06-09 HISTORY — DX: Hyperlipidemia, unspecified: E78.5

## 2024-06-09 LAB — COMPREHENSIVE METABOLIC PANEL WITH GFR
ALT: 44 U/L (ref 0–44)
AST: 49 U/L — ABNORMAL HIGH (ref 15–41)
Albumin: 4.5 g/dL (ref 3.5–5.0)
Alkaline Phosphatase: 48 U/L (ref 38–126)
Anion gap: 18 — ABNORMAL HIGH (ref 5–15)
BUN: 34 mg/dL — ABNORMAL HIGH (ref 6–20)
CO2: 28 mmol/L (ref 22–32)
Calcium: 10.1 mg/dL (ref 8.9–10.3)
Chloride: 92 mmol/L — ABNORMAL LOW (ref 98–111)
Creatinine, Ser: 2.26 mg/dL — ABNORMAL HIGH (ref 0.44–1.00)
GFR, Estimated: 24 mL/min — ABNORMAL LOW (ref >60)
Glucose, Bld: 129 mg/dL — ABNORMAL HIGH (ref 70–99)
Potassium: 2.8 mmol/L — ABNORMAL LOW (ref 3.5–5.1)
Sodium: 138 mmol/L (ref 135–145)
Total Bilirubin: 1 mg/dL (ref 0.0–1.2)
Total Protein: 7.5 g/dL (ref 6.5–8.1)

## 2024-06-09 LAB — URINALYSIS, ROUTINE W REFLEX MICROSCOPIC
Bilirubin Urine: NEGATIVE
Glucose, UA: NEGATIVE mg/dL
Ketones, ur: NEGATIVE mg/dL
Leukocytes,Ua: NEGATIVE
Nitrite: NEGATIVE
Protein, ur: 30 mg/dL — AB
Specific Gravity, Urine: 1.006 (ref 1.005–1.030)
pH: 5 (ref 5.0–8.0)

## 2024-06-09 LAB — LACTIC ACID, PLASMA
Lactic Acid, Venous: 2 mmol/L (ref 0.5–1.9)
Lactic Acid, Venous: 1.5 mmol/L (ref 0.5–1.9)

## 2024-06-09 LAB — CBC
HCT: 44.3 % (ref 36.0–46.0)
Hemoglobin: 15.5 g/dL — ABNORMAL HIGH (ref 12.0–15.0)
MCH: 30 pg (ref 26.0–34.0)
MCHC: 35 g/dL (ref 30.0–36.0)
MCV: 85.9 fL (ref 80.0–100.0)
Platelets: 265 K/uL (ref 150–400)
RBC: 5.16 MIL/uL — ABNORMAL HIGH (ref 3.87–5.11)
RDW: 12.5 % (ref 11.5–15.5)
WBC: 8.5 K/uL (ref 4.0–10.5)
nRBC: 0 % (ref 0.0–0.2)

## 2024-06-09 LAB — RESP PANEL BY RT-PCR (RSV, FLU A&B, COVID)  RVPGX2
Influenza A by PCR: NEGATIVE
Influenza B by PCR: NEGATIVE
Resp Syncytial Virus by PCR: NEGATIVE
SARS Coronavirus 2 by RT PCR: NEGATIVE

## 2024-06-09 LAB — GLUCOSE, CAPILLARY: Glucose-Capillary: 184 mg/dL — ABNORMAL HIGH (ref 70–99)

## 2024-06-09 LAB — LIPASE, BLOOD: Lipase: 163 U/L — ABNORMAL HIGH (ref 11–51)

## 2024-06-09 LAB — POTASSIUM: Potassium: 3.7 mmol/L (ref 3.5–5.1)

## 2024-06-09 LAB — MAGNESIUM: Magnesium: 2.2 mg/dL (ref 1.7–2.4)

## 2024-06-09 MED ORDER — ONDANSETRON HCL 4 MG PO TABS: 4.0000 mg | ORAL_TABLET | Freq: Four times a day (QID) | ORAL | Status: DC | PRN

## 2024-06-09 MED ORDER — HEPARIN SODIUM (PORCINE) 5000 UNIT/ML IJ SOLN
5000.0000 [IU] | Freq: Three times a day (TID) | INTRAMUSCULAR | Status: DC
  Administered 2024-06-09 – 2024-06-11 (×5): 5000 [IU] via SUBCUTANEOUS
  Filled 2024-06-09 (×4): qty 1

## 2024-06-09 MED ORDER — HYDRALAZINE HCL 20 MG/ML IJ SOLN: 5.0000 mg | Freq: Four times a day (QID) | INTRAMUSCULAR | Status: DC | PRN

## 2024-06-09 MED ORDER — ACETAMINOPHEN 650 MG RE SUPP: 650.0000 mg | Freq: Four times a day (QID) | RECTAL | Status: DC | PRN

## 2024-06-09 MED ORDER — POTASSIUM CHLORIDE 10 MEQ/100ML IV SOLN
10.0000 meq | INTRAVENOUS | Status: AC
  Administered 2024-06-09 (×3): 10 meq via INTRAVENOUS
  Filled 2024-06-09 (×2): qty 100

## 2024-06-09 MED ORDER — POTASSIUM CHLORIDE CRYS ER 20 MEQ PO TBCR
40.0000 meq | EXTENDED_RELEASE_TABLET | Freq: Once | ORAL | Status: DC
  Filled 2024-06-09: qty 2

## 2024-06-09 MED ORDER — SENNOSIDES-DOCUSATE SODIUM 8.6-50 MG PO TABS: 1.0000 | ORAL_TABLET | Freq: Every evening | ORAL | Status: DC | PRN

## 2024-06-09 MED ORDER — POTASSIUM CHLORIDE 10 MEQ/100ML IV SOLN
10.0000 meq | Freq: Once | INTRAVENOUS | Status: AC
  Administered 2024-06-09: 10 meq via INTRAVENOUS
  Filled 2024-06-09: qty 100

## 2024-06-09 MED ORDER — ONDANSETRON HCL 4 MG/2ML IJ SOLN: 4.0000 mg | Freq: Four times a day (QID) | INTRAMUSCULAR | Status: DC | PRN

## 2024-06-09 MED ORDER — LACTATED RINGERS IV SOLN: INTRAVENOUS | Status: AC

## 2024-06-09 MED ORDER — SODIUM CHLORIDE 0.9 % IV BOLUS
1000.0000 mL | Freq: Once | INTRAVENOUS | Status: AC
  Administered 2024-06-09: 1000 mL via INTRAVENOUS

## 2024-06-09 MED ORDER — ONDANSETRON HCL 4 MG/2ML IJ SOLN
4.0000 mg | Freq: Once | INTRAMUSCULAR | Status: AC
  Administered 2024-06-09: 4 mg via INTRAVENOUS
  Filled 2024-06-09: qty 2

## 2024-06-09 MED ORDER — ACETAMINOPHEN 325 MG PO TABS: 650.0000 mg | ORAL_TABLET | Freq: Four times a day (QID) | ORAL | Status: DC | PRN

## 2024-06-09 NOTE — Plan of Care (Signed)
  Problem: Activity: Goal: Risk for activity intolerance will decrease 06/09/2024 1735 by Cristopher Nivia BIRCH, RN Outcome: Progressing 06/09/2024 1734 by Cristopher Nivia BIRCH, RN Outcome: Progressing   Problem: Nutrition: Goal: Adequate nutrition will be maintained 06/09/2024 1735 by Cristopher Nivia BIRCH, RN Outcome: Progressing 06/09/2024 1734 by Cristopher Nivia BIRCH, RN Outcome: Progressing

## 2024-06-09 NOTE — Assessment & Plan Note (Signed)
 Suspect prerenal secondary to poor p.o. intake Status post sodium chloride 1 L bolus Continue with LR infusion at 125 mL/h, 1 day ordered

## 2024-06-09 NOTE — Assessment & Plan Note (Signed)
 With initial vomiting Query viral gastroenteritis Check COVID/influenza A/influenza B/RSV PCR

## 2024-06-09 NOTE — Assessment & Plan Note (Signed)
 Home rosuvastatin 20 mg daily not resumed on admission in setting of elevated liver enzymes A.m. team to resume when benefits outweigh the risk

## 2024-06-09 NOTE — ED Triage Notes (Signed)
 Patient states 9 days ago she started vomiting, has been nauseated since. Patient also reports mid abdominal pain, lack of energy and dizziness.

## 2024-06-09 NOTE — Assessment & Plan Note (Signed)
 Status post potassium chloride 10 mEq and IV one-time dose per EDP. On admission I have ordered potassium chloride IV 10 mEq, 3 doses, potassium chloride 40 mEq p.o. one-time dose Scheduled recheck of potassium level at 1800 hrs. Recheck BMP in the a.m.

## 2024-06-09 NOTE — ED Triage Notes (Signed)
 From [facility], poor urinary output, nausea, and dizziness. Symptoms started 11/4, was on Rybelsus that started on 10/30 and was stopped on 11/10.

## 2024-06-09 NOTE — ED Provider Notes (Signed)
 [facility] Provider Note    Event Date/Time   First MD Initiated Contact with Patient 06/09/24 1157     (approximate)   History   Emesis   HPI  Crystal Mann is a 58 y.o. female presents with abdominal pain.  Recently started on a semaglutide about 2 weeks ago, a few days ago then afterwards started developing poor p.o. intake nausea vomiting epigastric abdominal discomfort.  Has not been able to tolerate much p.o. since then, she states when she stands up or tries to walk around she becomes very lightheaded and the thought of food makes her very nauseous.  She has had multiple abdominal surgeries before in the past, denies any known history of pancreatitis.  She stopped taking the semaglutide about 3 days ago but continues to have symptoms despite this.      Physical Exam   Triage Vital Signs: ED Triage Vitals [06/09/24 1134]  Encounter Vitals Group     BP (!) 85/60     Girls Systolic BP Percentile      Girls Diastolic BP Percentile      Boys Systolic BP Percentile      Boys Diastolic BP Percentile      Pulse Rate 93     Resp 18     Temp 98 F (36.7 C)     Temp Source Oral     SpO2 100 %     Weight 198 lb (89.8 kg)     Height 5' 3 (1.6 m)     Head Circumference      Peak Flow      Pain Score 2     Pain Loc      Pain Education      Exclude from Growth Chart     Most recent vital signs: Vitals:   06/09/24 1201 06/09/24 1554  BP: (!) 89/65   Pulse: 81   Resp: 12   Temp:  98.1 F (36.7 C)  SpO2: 100%      General: Awake, no distress.  CV:  Good peripheral perfusion.  Resp:  Normal effort.  Abd:  No distention.  Soft with some epigastric discomfort to palpation Other:     ED Results / Procedures / Treatments   Labs (all labs ordered are listed, but only abnormal results are displayed) Labs Reviewed  LIPASE, BLOOD - Abnormal; Notable for the following components:      Result Value   Lipase 163 (*)    All other  components within normal limits  COMPREHENSIVE METABOLIC PANEL WITH GFR - Abnormal; Notable for the following components:   Potassium 2.8 (*)    Chloride 92 (*)    Glucose, Bld 129 (*)    BUN 34 (*)    Creatinine, Ser 2.26 (*)    AST 49 (*)    GFR, Estimated 24 (*)    Anion gap 18 (*)    All other components within normal limits  CBC - Abnormal; Notable for the following components:   RBC 5.16 (*)    Hemoglobin 15.5 (*)    All other components within normal limits  LACTIC ACID, PLASMA - Abnormal; Notable for the following components:   Lactic Acid, Venous 2.0 (*)    All other components within normal limits  URINALYSIS, ROUTINE W REFLEX MICROSCOPIC  LACTIC ACID, PLASMA  MAGNESIUM  POTASSIUM     EKG     RADIOLOGY   PROCEDURES:  Critical Care performed: Yes, see critical care procedure  note(s)  .Critical Care  Performed by: Fernand Rossie HERO, MD Authorized by: Fernand Rossie HERO, MD   Critical care provider statement:    Critical care time (minutes):  35   Critical care was necessary to treat or prevent imminent or life-threatening deterioration of the following conditions:  Dehydration   Critical care was time spent personally by me on the following activities:  Re-evaluation of patient's condition and examination of patient   I assumed direction of critical care for this patient from another provider in my specialty: yes     Care discussed with: admitting provider      MEDICATIONS ORDERED IN ED: Medications  acetaminophen (TYLENOL) tablet 650 mg (has no administration in time range)    Or  acetaminophen (TYLENOL) suppository 650 mg (has no administration in time range)  ondansetron (ZOFRAN) tablet 4 mg (has no administration in time range)    Or  ondansetron (ZOFRAN) injection 4 mg (has no administration in time range)  heparin injection 5,000 Units (has no administration in time range)  senna-docusate (Senokot-S) tablet 1 tablet (has no administration in time  range)  potassium chloride 10 mEq in 100 mL IVPB (10 mEq Intravenous New Bag/Given 06/09/24 1553)  potassium chloride SA (KLOR-CON M) CR tablet 40 mEq (40 mEq Oral Patient Refused/Not Given 06/09/24 1547)  lactated ringers infusion ( Intravenous New Bag/Given 06/09/24 1550)  hydrALAZINE (APRESOLINE) injection 5 mg (has no administration in time range)  sodium chloride 0.9 % bolus 1,000 mL (1,000 mLs Intravenous New Bag/Given 06/09/24 1251)  potassium chloride 10 mEq in 100 mL IVPB (0 mEq Intravenous Stopped 06/09/24 1427)  ondansetron (ZOFRAN) injection 4 mg (4 mg Intravenous Given 06/09/24 1545)     IMPRESSION / MDM / ASSESSMENT AND PLAN / ED COURSE  I reviewed the triage vital signs and the nursing notes.                               Patient's presentation is most consistent with acute presentation with potential threat to life or bodily function.  58 year old female who presents today for concern of abdominal pain in setting of recent new medication.  Here has developed an AKI, initially was hypotensive and fairly symptomatic.  Lipase is also slightly elevated and lactic initially is also slightly elevated here.  Working to initiate fluid resuscitation, and attempt symptomatic pain management.  Also hypokalemic so we will attempt to replete the potassium as well.  Given her developing AKI and continued symptoms we will discuss with medicine for admission.  Obtaining CT imaging to further assess for possible other underlying abdominal pathology will obtain without contrast given her significant AKI at this time.   Clinical Course as of 06/09/24 1559  Thu Jun 09, 2024  1456 Patient CT imaging is fortunately reassuring, will reach out to medicine team for admission for her AKI and hypokalemia. [SK]    Clinical Course User Index [SK] Fernand Rossie HERO, MD     FINAL CLINICAL IMPRESSION(S) / ED DIAGNOSES   Final diagnoses:  AKI (acute kidney injury)  Hypokalemia     Rx / DC Orders    ED Discharge Orders     None        Note:  This document was prepared using Dragon voice recognition software and may include unintentional dictation errors.   Fernand Rossie HERO, MD 06/09/24 [id number]

## 2024-06-09 NOTE — H&P (Signed)
 History and Physical   PEIGHTYN Mann FMW:[id number] DOB: [birth year] DOA: 06/09/2024  PCP: Lovetta Debby PARAS, MD  Patient coming from: Home  I have personally briefly reviewed patient's old medical records in [facility] EMR.  Chief Concern: Nausea, vomiting, poor p.o. intake.  HPI: Ms. Crystal Mann is a 58 year old female with history of fatty liver, hypertension, hyperlipidemia, who presents ED for chief concerns of intractable nausea and poor p.o. intake.  Vitals in the ED showed t of 98, rr 18, hr 74, blood pressure 85/60, improved to 83/69, SpO2 of 100% on room air.  Serum sodium is 138, potassium 2.8, chloride 92, bicarb 28, BUN of 34, serum creatinine of 2.26, eGFR 24, nonfasting blood glucose 129, WBC 8.5, hemoglobin 15.5, platelets of 265.  Lactic acid is 2.0.  AST is 49, ALT is 44.  ED treatment: Ondansetron 4 mg IV one-time dose, sodium chloride 1 L bolus, potassium chloride 10 mEq IV one-time dose. ------------------------------------ At bedside, patient able to tell me her first and last name, age, location, current calendar year.  She reports she has not been eating or drinking very well for the last 9 days.  She reports initially she was vomiting however now is just white foamy substances.  She denies any changes to her diet or respiratory infection.  Social history: She is at home with her husband.  She denies tobacco, EtOH, recreational drug use.  ROS: Constitutional: no weight change, no fever ENT/Mouth: no sore throat, no rhinorrhea Eyes: no eye pain, no vision changes Cardiovascular: no chest pain, no dyspnea,  no edema, no palpitations Respiratory: no cough, no sputum, no wheezing Gastrointestinal: no nausea, no vomiting, no diarrhea, no constipation Genitourinary: no urinary incontinence, no dysuria, no hematuria Musculoskeletal: no arthralgias, no myalgias Skin: no skin lesions, no pruritus, Neuro: + weakness, no loss of consciousness, no  syncope Psych: no anxiety, no depression, + decrease appetite Heme/Lymph: no bruising, no bleeding  ED Course: Discussed with EDP, patient requiring hospitalization for chief concerns of hypokalemia, acute kidney injury.  Assessment/Plan  Principal Problem:   Hypokalemia Active Problems:   Mixed hyperlipidemia   Benign essential HTN   Obesity (BMI 35.0-39.9 without comorbidity)   AKI (acute kidney injury)   Intractable nausea   Assessment and Plan:  * Hypokalemia Status post potassium chloride 10 mEq and IV one-time dose per EDP. On admission I have ordered potassium chloride IV 10 mEq, 3 doses, potassium chloride 40 mEq p.o. one-time dose Scheduled recheck of potassium level at 1800 hrs. Recheck BMP in the a.m.  Intractable nausea With initial vomiting Query viral gastroenteritis Check COVID/influenza A/influenza B/RSV PCR  AKI (acute kidney injury) Suspect prerenal secondary to poor p.o. intake Status post sodium chloride 1 L bolus Continue with LR infusion at 125 mL/h, 1 day ordered  Obesity (BMI 35.0-39.9 without comorbidity) This meets criteria for morbid obesity based on the presence of 1 or more chronic comorbidities. Patient has hypertension and BMI of 35.07. This complicates overall care and prognosis.   Benign essential HTN Hydralazine 5 mg IV every 6 hours Crystal needed for SBP > 165, 5 days ordered  Mixed hyperlipidemia Home rosuvastatin 20 mg daily not resumed on admission in setting of elevated liver enzymes A.m. team to resume when benefits outweigh the risk  Chart reviewed.   DVT prophylaxis: Heparin Code Status: Full code Diet: Heart healthy/carb modified Family Communication: Updated spouse Disposition Plan: Pending clinical course Consults called: No Admission status: Telemetry, observation  Past Medical History:  Diagnosis Date   Diabetes mellitus without complication (HCC)    Hyperlipidemia    Hypertension    Past Surgical History:   Procedure Laterality Date   BREAST BIOPSY Right 02/18/2019   10:00 venus clip us  bx FIBROSIS AND FOAMY MACROPHAGES    BREAST CYST ASPIRATION Bilateral    BREAST EXCISIONAL BIOPSY Right 2001   benign    Social History:  reports that she has never smoked. She has never used smokeless tobacco. She reports that she does not drink alcohol and does not use drugs.  Allergies  Allergen Reactions   Ceftin [Cefuroxime] Rash   Doxycycline Rash    PT. Stated, causes a rash.   Erythromycin Rash   Flagyl [Metronidazole] Rash   Sulfa Antibiotics Rash   Family History  Problem Relation Age of Onset   Breast cancer Maternal Grandmother 14   Family history: Family history reviewed and not pertinent.  Prior to Admission medications   Medication Sig Start Date End Date Taking? Authorizing Provider  Semaglutide 3 MG TABS Take 3 mg by mouth every 7 (seven) days. 05/22/24  Yes [provider]  atenolol (TENORMIN) 100 MG tablet Take 100 mg by mouth daily.    [provider]  estradiol (ESTRACE) 0.5 MG tablet Take 0.5 mg by mouth daily.    [provider]  hydrochlorothiazide (HYDRODIURIL) 25 MG tablet Take 25 mg by mouth daily.    [provider]  losartan (COZAAR) 25 MG tablet  07/02/23   [provider]  metFORMIN (GLUCOPHAGE) 500 MG tablet Take 500 mg by mouth 2 (two) times daily.    [provider]  rosuvastatin (CRESTOR) 20 MG tablet Take 20 mg by mouth daily.    [provider]  SUMAtriptan (IMITREX) 50 MG tablet Take 50 mg by mouth every 2 (two) hours Crystal needed for migraine. May repeat in 2 hours if headache persists or recurs.    [provider]    Physical Exam: Vitals:   06/09/24 1134 06/09/24 1201 06/09/24 1554 06/09/24 1712  BP: (!) 85/60 (!) 89/65  (!) 115/98  Pulse: 93 81  89  Resp: 18 12  16   Temp: 98 F (36.7 C)  98.1 F (36.7 C) 98.3 F (36.8 C)  TempSrc: Oral  Oral   SpO2: 100% 100%  100%  Weight:  89.8 kg     Height: 5' 3 (1.6 m)      Constitutional: appears age-appropriate, NAD, calm Eyes: PERRL, lids and conjunctivae normal ENMT: Mucous membranes are moist. Posterior pharynx clear of any exudate or lesions. Age-appropriate dentition. Hearing appropriate Neck: normal, supple, no masses, no thyromegaly Respiratory: clear to auscultation bilaterally, no wheezing, no crackles. Normal respiratory effort. No accessory muscle use.  Cardiovascular: Regular rate and rhythm, no murmurs / rubs / gallops. No extremity edema. 2+ pedal pulses. No carotid bruits.  Abdomen: no tenderness, no masses palpated, no hepatosplenomegaly. Bowel sounds positive.  Musculoskeletal: no clubbing / cyanosis. No joint deformity upper and lower extremities. Good ROM, no contractures, no atrophy. Normal muscle tone.  Skin: no rashes, lesions, ulcers. No induration Neurologic: Sensation intact. Strength 5/5 in all 4.  Psychiatric: Normal judgment and insight. Alert and oriented x 3. Normal mood.   EKG: Ordered and pending completion at this time  Chest x-ray on Admission: Not indicated at this time  CT ABDOMEN PELVIS WO CONTRAST Result Date: 06/09/2024 CLINICAL DATA:  Abdominal pain. EXAM: CT ABDOMEN AND PELVIS WITHOUT CONTRAST TECHNIQUE: Multidetector CT imaging of the abdomen  and pelvis was performed following the standard protocol without IV contrast. RADIATION DOSE REDUCTION: This exam was performed according to the departmental dose-optimization program which includes automated exposure control, adjustment of the mA and/or kV according to patient size and/or use of iterative reconstruction technique. COMPARISON:  CT abdomen pelvis dated 08/25/2011. FINDINGS: Evaluation of this exam is limited in the absence of intravenous contrast. Lower chest: The visualized lung bases are clear. No intra-abdominal free air or free fluid. Hepatobiliary: Fatty liver. No biliary dilatation. Cholecystectomy. Pancreas: Unremarkable.  No pancreatic ductal dilatation or surrounding inflammatory changes. Spleen: Normal in size without focal abnormality. Adrenals/Urinary Tract: The adrenal glands are unremarkable. The kidneys, visualized ureters, and urinary bladder unremarkable. Stomach/Bowel: There is no bowel obstruction or active inflammation. Appendectomy. Vascular/Lymphatic: Mild aortoiliac atherosclerotic disease. The IVC is unremarkable. No portal venous gas. There is no adenopathy. Reproductive: Hysterectomy.  No suspicious adnexal masses. Other: Midline vertical anterior pelvic wall incisional scar. There is abutment of several loops of bowel to the anterior peritoneal wall in the midline suggestive of adhesions. Musculoskeletal: No acute or significant osseous findings. IMPRESSION: 1. No acute intra-abdominal or pelvic pathology. 2. Fatty liver. 3.  Aortic Atherosclerosis (ICD10-I70.0). Electronically Signed   By: Vanetta Chou M.D.   On: 06/09/2024 14:43   Labs on Admission: I have personally reviewed following labs  CBC: Recent Labs  Lab 06/09/24 1137  WBC 8.5  HGB 15.5*  HCT 44.3  MCV 85.9  PLT 265   Basic Metabolic Panel: Recent Labs  Lab 06/09/24 1137 06/09/24 1612  NA 138  --   K 2.8*  --   CL 92*  --   CO2 28  --   GLUCOSE 129*  --   BUN 34*  --   CREATININE 2.26*  --   CALCIUM 10.1  --   MG  --  2.2   GFR: Estimated Creatinine Clearance: 28.5 mL/min (A) (by C-G formula based on SCr of 2.26 mg/dL (H)).  Liver Function Tests: Recent Labs  Lab 06/09/24 1137  AST 49*  ALT 44  ALKPHOS 48  BILITOT 1.0  PROT 7.5  ALBUMIN 4.5   Recent Labs  Lab 06/09/24 1137  LIPASE 163*   This document was prepared using Dragon Voice Recognition software and may include unintentional dictation errors.  Dr. Sherre [facility]  If 7PM-7AM, please contact overnight-coverage provider If 7AM-7PM, please contact day attending provider [email or web address]  06/09/2024, 6:15 PM

## 2024-06-09 NOTE — Assessment & Plan Note (Signed)
 Hydralazine 5 mg IV every 6 hours as needed for SBP > 165, 5 days ordered

## 2024-06-09 NOTE — Assessment & Plan Note (Signed)
 This meets criteria for morbid obesity based on the presence of 1 or more chronic comorbidities. Patient has hypertension and BMI of 35.07. This complicates overall care and prognosis.

## 2024-06-09 NOTE — Hospital Course (Signed)
 Ms. Crystal Mann is a 58 year old female with history of fatty liver, hypertension, hyperlipidemia, who presents ED for chief concerns of intractable nausea and poor p.o. intake.  Vitals in the ED showed t of 98, rr 18, hr 74, blood pressure 85/60, improved to 83/69, SpO2 of 100% on room air.  Serum sodium is 138, potassium 2.8, chloride 92, bicarb 28, BUN of 34, serum creatinine of 2.26, eGFR 24, nonfasting blood glucose 129, WBC 8.5, hemoglobin 15.5, platelets of 265.  Lactic acid is 2.0.  AST is 49, ALT is 44.  ED treatment: Ondansetron 4 mg IV one-time dose, sodium chloride 1 L bolus, potassium chloride 10 mEq IV one-time dose.

## 2024-06-10 DIAGNOSIS — I10 Essential (primary) hypertension: Secondary | ICD-10-CM | POA: Diagnosis present

## 2024-06-10 DIAGNOSIS — E876 Hypokalemia: Secondary | ICD-10-CM | POA: Diagnosis present

## 2024-06-10 DIAGNOSIS — E782 Mixed hyperlipidemia: Secondary | ICD-10-CM | POA: Diagnosis present

## 2024-06-10 DIAGNOSIS — Z1152 Encounter for screening for COVID-19: Secondary | ICD-10-CM | POA: Diagnosis not present

## 2024-06-10 DIAGNOSIS — Z79899 Other long term (current) drug therapy: Secondary | ICD-10-CM | POA: Diagnosis not present

## 2024-06-10 DIAGNOSIS — T50995A Adverse effect of other drugs, medicaments and biological substances, initial encounter: Secondary | ICD-10-CM | POA: Diagnosis present

## 2024-06-10 DIAGNOSIS — K760 Fatty (change of) liver, not elsewhere classified: Secondary | ICD-10-CM | POA: Diagnosis present

## 2024-06-10 DIAGNOSIS — E119 Type 2 diabetes mellitus without complications: Secondary | ICD-10-CM | POA: Diagnosis present

## 2024-06-10 DIAGNOSIS — N179 Acute kidney failure, unspecified: Secondary | ICD-10-CM | POA: Diagnosis present

## 2024-06-10 DIAGNOSIS — Z6835 Body mass index (BMI) 35.0-35.9, adult: Secondary | ICD-10-CM | POA: Diagnosis not present

## 2024-06-10 DIAGNOSIS — R319 Hematuria, unspecified: Secondary | ICD-10-CM | POA: Diagnosis present

## 2024-06-10 DIAGNOSIS — R197 Diarrhea, unspecified: Secondary | ICD-10-CM | POA: Diagnosis present

## 2024-06-10 DIAGNOSIS — Z7984 Long term (current) use of oral hypoglycemic drugs: Secondary | ICD-10-CM | POA: Diagnosis not present

## 2024-06-10 LAB — MAGNESIUM: Magnesium: 2.1 mg/dL (ref 1.7–2.4)

## 2024-06-10 LAB — CBC
HCT: 34.8 % — ABNORMAL LOW (ref 36.0–46.0)
Hemoglobin: 12.1 g/dL (ref 12.0–15.0)
MCH: 30.2 pg (ref 26.0–34.0)
MCHC: 34.8 g/dL (ref 30.0–36.0)
MCV: 86.8 fL (ref 80.0–100.0)
Platelets: 195 K/uL (ref 150–400)
RBC: 4.01 MIL/uL (ref 3.87–5.11)
RDW: 12.5 % (ref 11.5–15.5)
WBC: 7.3 K/uL (ref 4.0–10.5)
nRBC: 0 % (ref 0.0–0.2)

## 2024-06-10 LAB — BASIC METABOLIC PANEL WITH GFR
Anion gap: 11 (ref 5–15)
BUN: 25 mg/dL — ABNORMAL HIGH (ref 6–20)
CO2: 30 mmol/L (ref 22–32)
Calcium: 8.8 mg/dL — ABNORMAL LOW (ref 8.9–10.3)
Chloride: 100 mmol/L (ref 98–111)
Creatinine, Ser: 1.8 mg/dL — ABNORMAL HIGH (ref 0.44–1.00)
GFR, Estimated: 32 mL/min — ABNORMAL LOW (ref >60)
Glucose, Bld: 84 mg/dL (ref 70–99)
Potassium: 3 mmol/L — ABNORMAL LOW (ref 3.5–5.1)
Sodium: 140 mmol/L (ref 135–145)

## 2024-06-10 MED ORDER — ENSURE PLUS HIGH PROTEIN PO LIQD: 237.0000 mL | Freq: Two times a day (BID) | ORAL | Status: DC

## 2024-06-10 MED ORDER — POTASSIUM CHLORIDE CRYS ER 20 MEQ PO TBCR: 40.0000 meq | EXTENDED_RELEASE_TABLET | Freq: Two times a day (BID) | ORAL | Status: DC

## 2024-06-10 MED ORDER — OMEPRAZOLE 20 MG PO TBDD
20.0000 mg | DELAYED_RELEASE_TABLET | Freq: Every day | ORAL | Status: DC
  Administered 2024-06-10 – 2024-06-11 (×2): 20 mg via ORAL
  Filled 2024-06-10 (×2): qty 1

## 2024-06-10 MED ORDER — ESTRADIOL 0.5 MG PO TABS
0.5000 mg | ORAL_TABLET | Freq: Every day | ORAL | Status: DC
  Administered 2024-06-10 – 2024-06-11 (×2): 0.5 mg via ORAL
  Filled 2024-06-10 (×2): qty 1

## 2024-06-10 MED ORDER — POTASSIUM CHLORIDE 10 MEQ/100ML IV SOLN
10.0000 meq | INTRAVENOUS | Status: AC
  Administered 2024-06-10 (×6): 10 meq via INTRAVENOUS
  Filled 2024-06-10 (×3): qty 100

## 2024-06-10 NOTE — Progress Notes (Signed)
 PROGRESS NOTE    Crystal Mann  FMW:[id number] DOB: Dec 20, 1965 DOA: 06/09/2024 PCP: Lovetta Debby PARAS, MD   Assessment & Plan:   Principal Problem:   Hypokalemia Active Problems:   Mixed hyperlipidemia   Benign essential HTN   Obesity (BMI 35.0-39.9 without comorbidity)   AKI (acute kidney injury)   Intractable nausea  Assessment and Plan: Hypokalemia: potassium ordered. Mg is WNL. Likely secondary to diarrhea from recent use of rybelsus which pt has stopped taking    Intractable nausea: etiology unclear, possibly medication ADRs. Influenza, RSV & COVID19 all neg. Zofran prn    AKI: likely prerenal. Cr is trending down from day prior. Continue on IVFs   Obesity: BMI 35.0. Would benefit from weight loss   HTN: holding home dose of atenolol, hydrochlorothiazide, losartan as BP is on the low end of normal    HLD: holding statin secondary to elevated LFTs  Hematuria: UA is positive for Hb & bacteria many. Urine cx ordered       DVT prophylaxis: heparin Code Status: full  Family Communication: discussed pt's care w/ pt's family at bedside and answered their questions  Disposition Plan: likely d/c back home  Level of care: Telemetry  Status is: Inpatient Remains inpatient appropriate because: severity of illness    Consultants:    Procedures:   Antimicrobials:   Subjective: Pt c/o diarrhea and nausea  Objective: Vitals:   06/09/24 1712 06/09/24 2015 06/10/24 0455 06/10/24 0801  BP: (!) 115/98 (!) 103/35 93/64 99/76   Pulse: 89 85 68 73  Resp: 16 16 18 17   Temp: 98.3 F (36.8 C) 99.2 F (37.3 C) 98.3 F (36.8 C) 98.6 F (37 C)  TempSrc:      SpO2: 100% 97% 100% 100%  Weight:      Height:        Intake/Output Summary (Last 24 hours) at 06/10/2024 0837 Last data filed at 06/10/2024 0318 Gross per 24 hour  Intake 2521.95 ml  Output --  Net 2521.95 ml   Filed Weights   06/09/24 1134  Weight: 89.8 kg    Examination:  General exam:  Appears calm and comfortable  Respiratory system: Clear to auscultation. Respiratory effort normal. Cardiovascular system: S1 & S2+. No rubs, gallops or clicks.  Gastrointestinal system: Abdomen is obese, soft and nontender. hyperactive bowel sounds heard. Central nervous system: Alert and oriented. Moves all extremities Psychiatry: Judgement and insight appear normal. Mood & affect appropriate.     Data Reviewed: I have personally reviewed following labs and imaging studies  CBC: Recent Labs  Lab 06/09/24 1137 06/10/24 0344  WBC 8.5 7.3  HGB 15.5* 12.1  HCT 44.3 34.8*  MCV 85.9 86.8  PLT 265 195   Basic Metabolic Panel: Recent Labs  Lab 06/09/24 1137 06/09/24 1612 06/09/24 1752 06/10/24 0344  NA 138  --   --  140  K 2.8*  --  3.7 3.0*  CL 92*  --   --  100  CO2 28  --   --  30  GLUCOSE 129*  --   --  84  BUN 34*  --   --  25*  CREATININE 2.26*  --   --  1.80*  CALCIUM 10.1  --   --  8.8*  MG  --  2.2  --   --    GFR: Estimated Creatinine Clearance: 35.8 mL/min (A) (by C-G formula based on SCr of 1.8 mg/dL (H)). Liver Function Tests: Recent Labs  Lab 06/09/24 1137  AST 49*  ALT 44  ALKPHOS 48  BILITOT 1.0  PROT 7.5  ALBUMIN 4.5   Recent Labs  Lab 06/09/24 1137  LIPASE 163*   No results for input(s): AMMONIA in the last 168 hours. Coagulation Profile: No results for input(s): INR, PROTIME in the last 168 hours. Cardiac Enzymes: No results for input(s): CKTOTAL, CKMB, CKMBINDEX, TROPONINI in the last 168 hours. BNP (last 3 results) No results for input(s): PROBNP in the last 8760 hours. HbA1C: No results for input(s): HGBA1C in the last 72 hours. CBG: Recent Labs  Lab 06/09/24 2013  GLUCAP 184*   Lipid Profile: No results for input(s): CHOL, HDL, LDLCALC, TRIG, CHOLHDL, LDLDIRECT in the last 72 hours. Thyroid Function Tests: No results for input(s): TSH, T4TOTAL, FREET4, T3FREE, THYROIDAB in the last 72  hours. Anemia Panel: No results for input(s): VITAMINB12, FOLATE, FERRITIN, TIBC, IRON, RETICCTPCT in the last 72 hours. Sepsis Labs: Recent Labs  Lab 06/09/24 1137 06/09/24 1612  LATICACIDVEN 2.0* 1.5    Recent Results (from the past 240 hours)  Resp panel by RT-PCR (RSV, Flu A&B, Covid) Anterior Nasal Swab     Status: None   Collection Time: 06/09/24  8:10 PM   Specimen: Anterior Nasal Swab  Result Value Ref Range Status   SARS Coronavirus 2 by RT PCR NEGATIVE NEGATIVE Final    Comment: (NOTE) SARS-CoV-2 target nucleic acids are NOT DETECTED.  The SARS-CoV-2 RNA is generally detectable in upper respiratory specimens during the acute phase of infection. The lowest concentration of SARS-CoV-2 viral copies this assay can detect is 138 copies/mL. A negative result does not preclude SARS-Cov-2 infection and should not be used as the sole basis for treatment or other patient management decisions. A negative result may occur with  improper specimen collection/handling, submission of specimen other than nasopharyngeal swab, presence of viral mutation(s) within the areas targeted by this assay, and inadequate number of viral copies(<138 copies/mL). A negative result must be combined with clinical observations, patient history, and epidemiological information. The expected result is Negative.  Fact Sheet for Patients:  bloggercourse.com  Fact Sheet for Healthcare Providers:  seriousbroker.it  This test is no t yet approved or cleared by the [place]  FDA and  has been authorized for detection and/or diagnosis of SARS-CoV-2 by FDA under an Emergency Use Authorization (EUA). This EUA will remain  in effect (meaning this test can be used) for the duration of the COVID-19 declaration under Section 564(b)(1) of the Act, 21 U.S.C.section 360bbb-3(b)(1), unless the authorization is terminated  or revoked sooner.        Influenza A by PCR NEGATIVE NEGATIVE Final   Influenza B by PCR NEGATIVE NEGATIVE Final    Comment: (NOTE) The Xpert Xpress SARS-CoV-2/FLU/RSV plus assay is intended as an aid in the diagnosis of influenza from Nasopharyngeal swab specimens and should not be used as a sole basis for treatment. Nasal washings and aspirates are unacceptable for Xpert Xpress SARS-CoV-2/FLU/RSV testing.  Fact Sheet for Patients: bloggercourse.com  Fact Sheet for Healthcare Providers: seriousbroker.it  This test is not yet approved or cleared by the [place]  FDA and has been authorized for detection and/or diagnosis of SARS-CoV-2 by FDA under an Emergency Use Authorization (EUA). This EUA will remain in effect (meaning this test can be used) for the duration of the COVID-19 declaration under Section 564(b)(1) of the Act, 21 U.S.C. section 360bbb-3(b)(1), unless the authorization is terminated or revoked.     Resp Syncytial Virus by PCR NEGATIVE NEGATIVE  Final    Comment: (NOTE) Fact Sheet for Patients: bloggercourse.com  Fact Sheet for Healthcare Providers: seriousbroker.it  This test is not yet approved or cleared by the [place]  FDA and has been authorized for detection and/or diagnosis of SARS-CoV-2 by FDA under an Emergency Use Authorization (EUA). This EUA will remain in effect (meaning this test can be used) for the duration of the COVID-19 declaration under Section 564(b)(1) of the Act, 21 U.S.C. section 360bbb-3(b)(1), unless the authorization is terminated or revoked.  Performed at [facility], [address], [place], [place] [address]          Radiology Studies: CT ABDOMEN PELVIS WO CONTRAST Result Date: 06/09/2024 CLINICAL DATA:  Abdominal pain. EXAM: CT ABDOMEN AND PELVIS WITHOUT CONTRAST TECHNIQUE: Multidetector CT imaging of the abdomen and pelvis was  performed following the standard protocol without IV contrast. RADIATION DOSE REDUCTION: This exam was performed according to the departmental dose-optimization program which includes automated exposure control, adjustment of the mA and/or kV according to patient size and/or use of iterative reconstruction technique. COMPARISON:  CT abdomen pelvis dated 08/25/2011. FINDINGS: Evaluation of this exam is limited in the absence of intravenous contrast. Lower chest: The visualized lung bases are clear. No intra-abdominal free air or free fluid. Hepatobiliary: Fatty liver. No biliary dilatation. Cholecystectomy. Pancreas: Unremarkable. No pancreatic ductal dilatation or surrounding inflammatory changes. Spleen: Normal in size without focal abnormality. Adrenals/Urinary Tract: The adrenal glands are unremarkable. The kidneys, visualized ureters, and urinary bladder unremarkable. Stomach/Bowel: There is no bowel obstruction or active inflammation. Appendectomy. Vascular/Lymphatic: Mild aortoiliac atherosclerotic disease. The IVC is unremarkable. No portal venous gas. There is no adenopathy. Reproductive: Hysterectomy.  No suspicious adnexal masses. Other: Midline vertical anterior pelvic wall incisional scar. There is abutment of several loops of bowel to the anterior peritoneal wall in the midline suggestive of adhesions. Musculoskeletal: No acute or significant osseous findings. IMPRESSION: 1. No acute intra-abdominal or pelvic pathology. 2. Fatty liver. 3.  Aortic Atherosclerosis (ICD10-I70.0). Electronically Signed   By: Vanetta Chou M.D.   On: 06/09/2024 14:43        Scheduled Meds:  feeding supplement  237 mL Oral BID BM   heparin  5,000 Units Subcutaneous Q8H   potassium chloride  40 mEq Oral Once   Continuous Infusions:  lactated ringers 125 mL/hr at 06/10/24 0318     LOS: 0 days       Anthony CHRISTELLA Pouch, MD [facility] Pager [phone]  If 7PM-7AM, please contact  night-coverage [email or web address] 06/10/2024, 8:37 AM

## 2024-06-10 NOTE — Plan of Care (Signed)

## 2024-06-10 NOTE — Plan of Care (Signed)
  Problem: Education: Goal: Knowledge of General Education information will improve Description: Including pain rating scale, medication(s)/side effects and non-pharmacologic comfort measures 06/10/2024 2020 by Arlyss Tolbert BIRCH, RN Outcome: Progressing 06/10/2024 2020 by Arlyss Tolbert BIRCH, RN Outcome: Progressing   Problem: Health Behavior/Discharge Planning: Goal: Ability to manage health-related needs will improve 06/10/2024 2020 by Arlyss Tolbert BIRCH, RN Outcome: Progressing 06/10/2024 2020 by Arlyss Tolbert D, RN Outcome: Progressing   Problem: Clinical Measurements: Goal: Ability to maintain clinical measurements within normal limits will improve 06/10/2024 2020 by Arlyss Tolbert BIRCH, RN Outcome: Progressing 06/10/2024 2020 by Arlyss Tolbert D, RN Outcome: Progressing Goal: Will remain free from infection 06/10/2024 2020 by Arlyss Tolbert D, RN Outcome: Progressing 06/10/2024 2020 by Arlyss Tolbert D, RN Outcome: Progressing Goal: Diagnostic test results will improve 06/10/2024 2020 by Arlyss Tolbert BIRCH, RN Outcome: Progressing 06/10/2024 2020 by Arlyss Tolbert D, RN Outcome: Progressing Goal: Respiratory complications will improve 06/10/2024 2020 by Arlyss Tolbert D, RN Outcome: Progressing 06/10/2024 2020 by Arlyss Tolbert D, RN Outcome: Progressing Goal: Cardiovascular complication will be avoided 06/10/2024 2020 by Arlyss Tolbert D, RN Outcome: Progressing 06/10/2024 2020 by Arlyss Tolbert BIRCH, RN Outcome: Progressing   Problem: Activity: Goal: Risk for activity intolerance will decrease 06/10/2024 2020 by Arlyss Tolbert D, RN Outcome: Progressing 06/10/2024 2020 by Arlyss Tolbert D, RN Outcome: Progressing   Problem: Nutrition: Goal: Adequate nutrition will be maintained 06/10/2024 2020 by Arlyss Tolbert BIRCH, RN Outcome: Progressing 06/10/2024 2020 by Arlyss Tolbert D, RN Outcome: Progressing   Problem: Coping: Goal: Level of anxiety will  decrease 06/10/2024 2020 by Arlyss Tolbert D, RN Outcome: Progressing 06/10/2024 2020 by Arlyss Tolbert D, RN Outcome: Progressing   Problem: Elimination: Goal: Will not experience complications related to bowel motility 06/10/2024 2020 by Arlyss Tolbert BIRCH, RN Outcome: Progressing 06/10/2024 2020 by Arlyss Tolbert D, RN Outcome: Progressing Goal: Will not experience complications related to urinary retention 06/10/2024 2020 by Arlyss Tolbert BIRCH, RN Outcome: Progressing 06/10/2024 2020 by Arlyss Tolbert D, RN Outcome: Progressing   Problem: Pain Managment: Goal: General experience of comfort will improve and/or be controlled 06/10/2024 2020 by Arlyss Tolbert BIRCH, RN Outcome: Progressing 06/10/2024 2020 by Arlyss Tolbert D, RN Outcome: Progressing   Problem: Safety: Goal: Ability to remain free from injury will improve 06/10/2024 2020 by Arlyss Tolbert BIRCH, RN Outcome: Progressing 06/10/2024 2020 by Arlyss Tolbert D, RN Outcome: Progressing   Problem: Skin Integrity: Goal: Risk for impaired skin integrity will decrease 06/10/2024 2020 by Arlyss Tolbert D, RN Outcome: Progressing 06/10/2024 2020 by Arlyss Tolbert BIRCH, RN Outcome: Progressing

## 2024-06-10 NOTE — Plan of Care (Signed)
 D/c

## 2024-06-11 DIAGNOSIS — E876 Hypokalemia: Secondary | ICD-10-CM | POA: Diagnosis not present

## 2024-06-11 LAB — BASIC METABOLIC PANEL WITH GFR
Anion gap: 8 (ref 5–15)
BUN: 15 mg/dL (ref 6–20)
CO2: 30 mmol/L (ref 22–32)
Calcium: 9.2 mg/dL (ref 8.9–10.3)
Chloride: 103 mmol/L (ref 98–111)
Creatinine, Ser: 1.32 mg/dL — ABNORMAL HIGH (ref 0.44–1.00)
GFR, Estimated: 46 mL/min — ABNORMAL LOW (ref >60)
Glucose, Bld: 115 mg/dL — ABNORMAL HIGH (ref 70–99)
Potassium: 3.5 mmol/L (ref 3.5–5.1)
Sodium: 141 mmol/L (ref 135–145)

## 2024-06-11 NOTE — Discharge Summary (Signed)
 Physician Discharge Summary  Crystal Mann FMW:[id number] DOB: May 24, 1966 DOA: 06/09/2024  PCP: Lovetta Debby PARAS, MD  Admit date: 06/09/2024 Discharge date: 06/11/2024  Admitted From: home  Disposition:  home   Recommendations for Outpatient Follow-up:  Follow up with PCP in 1-2 weeks   Home Health: no  Equipment/Devices:\  Discharge Condition: stable CODE STATUS: full  Diet recommendation: Carb Modified   Brief/Interim Summary: HPI was taken from Dr. Sherre: Ms. Crystal Mann is a 58 year old female with history of fatty liver, hypertension, hyperlipidemia, who presents ED for chief concerns of intractable nausea and poor p.o. intake.   Vitals in the ED showed t of 98, rr 18, hr 74, blood pressure 85/60, improved to 83/69, SpO2 of 100% on room air.   Serum sodium is 138, potassium 2.8, chloride 92, bicarb 28, BUN of 34, serum creatinine of 2.26, eGFR 24, nonfasting blood glucose 129, WBC 8.5, hemoglobin 15.5, platelets of 265.   Lactic acid is 2.0.  AST is 49, ALT is 44.   ED treatment: Ondansetron 4 mg IV one-time dose, sodium chloride 1 L bolus, potassium chloride 10 mEq IV one-time dose. ------------------------------------ At bedside, patient able to tell me her first and last name, age, location, current calendar year.   She reports she has not been eating or drinking very well for the last 9 days.   She reports initially she was vomiting however now is just white foamy substances.  She denies any changes to her diet or respiratory infection.  Discharge Diagnoses:  Principal Problem:   Hypokalemia Active Problems:   Mixed hyperlipidemia   Benign essential HTN   Obesity (BMI 35.0-39.9 without comorbidity)   AKI (acute kidney injury)   Intractable nausea  Hypokalemia: WNL today. Mg is WNL. Likely secondary to diarrhea from recent use of rybelsus which pt has stopped taking    Intractable nausea: etiology unclear, possibly medication ADRs. Influenza,  RSV & COVID19 all neg. Zofran prn. Resolved   AKI: likely prerenal. Cr is trending down daily    Obesity: BMI 35.0. Would benefit from weight loss   HTN: restart home dose of atenolol, hydrochlorothiazide, losartan   HLD: restart statin  Hematuria: UA is positive for Hb & bacteria many. Urine cx is pending. No hematuria today as per pt   Discharge Instructions  Discharge Instructions     Diet general   Complete by: As directed    Discharge instructions   Complete by: As directed    F/u w/ PCP in 1-2 weeks   Increase activity slowly   Complete by: As directed       Allergies as of 06/11/2024       Reactions   Ceftin [cefuroxime] Rash   Doxycycline Rash   PT. Stated, causes a rash.   Erythromycin Rash   Flagyl [metronidazole] Rash   Sulfa Antibiotics Rash        Medication List     STOP taking these medications    Semaglutide 3 MG Tabs       TAKE these medications    atenolol 100 MG tablet Commonly known as: TENORMIN Take 100 mg by mouth daily.   esomeprazole 40 MG capsule Commonly known as: NEXIUM Take 40 mg by mouth daily.   estradiol 0.5 MG tablet Commonly known as: ESTRACE Take 0.5 mg by mouth daily.   hydrochlorothiazide 25 MG tablet Commonly known as: HYDRODIURIL Take 25 mg by mouth daily.   losartan 25 MG tablet Commonly known as: COZAAR   metFORMIN  500 MG tablet Commonly known as: GLUCOPHAGE Take 500 mg by mouth 2 (two) times daily.   rosuvastatin 20 MG tablet Commonly known as: CRESTOR Take 20 mg by mouth daily.   SUMAtriptan 50 MG tablet Commonly known as: IMITREX Take 50 mg by mouth every 2 (two) hours as needed for migraine. May repeat in 2 hours if headache persists or recurs.        Allergies  Allergen Reactions   Ceftin [Cefuroxime] Rash   Doxycycline Rash    PT. Stated, causes a rash.   Erythromycin Rash   Flagyl [Metronidazole] Rash   Sulfa Antibiotics Rash     Consultations:    Procedures/Studies: CT ABDOMEN PELVIS WO CONTRAST Result Date: 06/09/2024 CLINICAL DATA:  Abdominal pain. EXAM: CT ABDOMEN AND PELVIS WITHOUT CONTRAST TECHNIQUE: Multidetector CT imaging of the abdomen and pelvis was performed following the standard protocol without IV contrast. RADIATION DOSE REDUCTION: This exam was performed according to the departmental dose-optimization program which includes automated exposure control, adjustment of the mA and/or kV according to patient size and/or use of iterative reconstruction technique. COMPARISON:  CT abdomen pelvis dated 08/25/2011. FINDINGS: Evaluation of this exam is limited in the absence of intravenous contrast. Lower chest: The visualized lung bases are clear. No intra-abdominal free air or free fluid. Hepatobiliary: Fatty liver. No biliary dilatation. Cholecystectomy. Pancreas: Unremarkable. No pancreatic ductal dilatation or surrounding inflammatory changes. Spleen: Normal in size without focal abnormality. Adrenals/Urinary Tract: The adrenal glands are unremarkable. The kidneys, visualized ureters, and urinary bladder unremarkable. Stomach/Bowel: There is no bowel obstruction or active inflammation. Appendectomy. Vascular/Lymphatic: Mild aortoiliac atherosclerotic disease. The IVC is unremarkable. No portal venous gas. There is no adenopathy. Reproductive: Hysterectomy.  No suspicious adnexal masses. Other: Midline vertical anterior pelvic wall incisional scar. There is abutment of several loops of bowel to the anterior peritoneal wall in the midline suggestive of adhesions. Musculoskeletal: No acute or significant osseous findings. IMPRESSION: 1. No acute intra-abdominal or pelvic pathology. 2. Fatty liver. 3.  Aortic Atherosclerosis (ICD10-I70.0). Electronically Signed   By: [place]etta Chou M.D.   On: 06/09/2024 14:43   (Echo, Carotid, EGD, Colonoscopy, ERCP)    Subjective: Pt denies any nausea or vomiting    Discharge  Exam: Vitals:   06/11/24 0508 06/11/24 0815  BP: (!) 100/40 112/72  Pulse: 70 74  Resp: 18 18  Temp: 98.2 F (36.8 C)   SpO2: 100% 99%   Vitals:   06/10/24 1535 06/10/24 2002 06/11/24 0508 06/11/24 0815  BP: (!) 91/46 90/64 (!) 100/40 112/72  Pulse: 70 83 70 74  Resp: 17 17 18 18   Temp: 98.2 F (36.8 C) 98.3 F (36.8 C) 98.2 F (36.8 C)   TempSrc:      SpO2: 100% 98% 100% 99%  Weight:      Height:        General: Pt is alert, awake, not in acute distress Cardiovascular: S1/S2 +, no rubs, no gallops Respiratory: CTA bilaterally, no wheezing, no rhonchi Abdominal: Soft, NT, obese, bowel sounds + Extremities: no edema, no cyanosis    The results of significant diagnostics from this hospitalization (including imaging, microbiology, ancillary and laboratory) are listed below for reference.     Microbiology: Recent Results (from the past 240 hours)  Resp panel by RT-PCR (RSV, Flu A&B, Covid) Anterior Nasal Swab     Status: None   Collection Time: 06/09/24  8:10 PM   Specimen: Anterior Nasal Swab  Result Value Ref Range Status   SARS Coronavirus  2 by RT PCR NEGATIVE NEGATIVE Final    Comment: (NOTE) SARS-CoV-2 target nucleic acids are NOT DETECTED.  The SARS-CoV-2 RNA is generally detectable in upper respiratory specimens during the acute phase of infection. The lowest concentration of SARS-CoV-2 viral copies this assay can detect is 138 copies/mL. A negative result does not preclude SARS-Cov-2 infection and should not be used as the sole basis for treatment or other patient management decisions. A negative result may occur with  improper specimen collection/handling, submission of specimen other than nasopharyngeal swab, presence of viral mutation(s) within the areas targeted by this assay, and inadequate number of viral copies(<138 copies/mL). A negative result must be combined with clinical observations, patient history, and epidemiological information. The  expected result is Negative.  Fact Sheet for Patients:  bloggercourse.com  Fact Sheet for Healthcare Providers:  seriousbroker.it  This test is no t yet approved or cleared by the [place]  FDA and  has been authorized for detection and/or diagnosis of SARS-CoV-2 by FDA under an Emergency Use Authorization (EUA). This EUA will remain  in effect (meaning this test can be used) for the duration of the COVID-19 declaration under Section 564(b)(1) of the Act, 21 U.S.C.section 360bbb-3(b)(1), unless the authorization is terminated  or revoked sooner.       Influenza A by PCR NEGATIVE NEGATIVE Final   Influenza B by PCR NEGATIVE NEGATIVE Final    Comment: (NOTE) The Xpert Xpress SARS-CoV-2/FLU/RSV plus assay is intended as an aid in the diagnosis of influenza from Nasopharyngeal swab specimens and should not be used as a sole basis for treatment. Nasal washings and aspirates are unacceptable for Xpert Xpress SARS-CoV-2/FLU/RSV testing.  Fact Sheet for Patients: bloggercourse.com  Fact Sheet for Healthcare Providers: seriousbroker.it  This test is not yet approved or cleared by the [place]  FDA and has been authorized for detection and/or diagnosis of SARS-CoV-2 by FDA under an Emergency Use Authorization (EUA). This EUA will remain in effect (meaning this test can be used) for the duration of the COVID-19 declaration under Section 564(b)(1) of the Act, 21 U.S.C. section 360bbb-3(b)(1), unless the authorization is terminated or revoked.     Resp Syncytial Virus by PCR NEGATIVE NEGATIVE Final    Comment: (NOTE) Fact Sheet for Patients: bloggercourse.com  Fact Sheet for Healthcare Providers: seriousbroker.it  This test is not yet approved or cleared by the [place]  FDA and has been authorized for detection and/or  diagnosis of SARS-CoV-2 by FDA under an Emergency Use Authorization (EUA). This EUA will remain in effect (meaning this test can be used) for the duration of the COVID-19 declaration under Section 564(b)(1) of the Act, 21 U.S.C. section 360bbb-3(b)(1), unless the authorization is terminated or revoked.  Performed at [facility], [address] [place], [place] [address]      Labs: BNP (last 3 results) No results for input(s): BNP in the last 8760 hours. Basic Metabolic Panel: Recent Labs  Lab 06/09/24 1137 06/09/24 1612 06/09/24 1752 06/10/24 0344 06/11/24 1000  NA 138  --   --  140 141  K 2.8*  --  3.7 3.0* 3.5  CL 92*  --   --  100 103  CO2 28  --   --  30 30  GLUCOSE 129*  --   --  84 115*  BUN 34*  --   --  25* 15  CREATININE 2.26*  --   --  1.80* 1.32*  CALCIUM 10.1  --   --  8.8* 9.2  MG  --  2.2  --  2.1  --    Liver Function Tests: Recent Labs  Lab 06/09/24 1137  AST 49*  ALT 44  ALKPHOS 48  BILITOT 1.0  PROT 7.5  ALBUMIN 4.5   Recent Labs  Lab 06/09/24 1137  LIPASE 163*   No results for input(s): AMMONIA in the last 168 hours. CBC: Recent Labs  Lab 06/09/24 1137 06/10/24 0344  WBC 8.5 7.3  HGB 15.5* 12.1  HCT 44.3 34.8*  MCV 85.9 86.8  PLT 265 195   Cardiac Enzymes: No results for input(s): CKTOTAL, CKMB, CKMBINDEX, TROPONINI in the last 168 hours. BNP: Invalid input(s): POCBNP CBG: Recent Labs  Lab 06/09/24 2013  GLUCAP 184*   D-Dimer No results for input(s): DDIMER in the last 72 hours. Hgb A1c No results for input(s): HGBA1C in the last 72 hours. Lipid Profile No results for input(s): CHOL, HDL, LDLCALC, TRIG, CHOLHDL, LDLDIRECT in the last 72 hours. Thyroid function studies No results for input(s): TSH, T4TOTAL, T3FREE, THYROIDAB in the last 72 hours.  Invalid input(s): FREET3 Anemia work up No results for input(s): VITAMINB12, FOLATE, FERRITIN, TIBC, IRON,  RETICCTPCT in the last 72 hours. Urinalysis    Component Value Date/Time   COLORURINE YELLOW (A) 06/09/2024 1612   APPEARANCEUR HAZY (A) 06/09/2024 1612   LABSPEC 1.006 06/09/2024 1612   PHURINE 5.0 06/09/2024 1612   GLUCOSEU NEGATIVE 06/09/2024 1612   HGBUR SMALL (A) 06/09/2024 1612   BILIRUBINUR NEGATIVE 06/09/2024 1612   KETONESUR NEGATIVE 06/09/2024 1612   PROTEINUR 30 (A) 06/09/2024 1612   NITRITE NEGATIVE 06/09/2024 1612   LEUKOCYTESUR NEGATIVE 06/09/2024 1612   Sepsis Labs Recent Labs  Lab 06/09/24 1137 06/10/24 0344  WBC 8.5 7.3   Microbiology Recent Results (from the past 240 hours)  Resp panel by RT-PCR (RSV, Flu A&B, Covid) Anterior Nasal Swab     Status: None   Collection Time: 06/09/24  8:10 PM   Specimen: Anterior Nasal Swab  Result Value Ref Range Status   SARS Coronavirus 2 by RT PCR NEGATIVE NEGATIVE Final    Comment: (NOTE) SARS-CoV-2 target nucleic acids are NOT DETECTED.  The SARS-CoV-2 RNA is generally detectable in upper respiratory specimens during the acute phase of infection. The lowest concentration of SARS-CoV-2 viral copies this assay can detect is 138 copies/mL. A negative result does not preclude SARS-Cov-2 infection and should not be used as the sole basis for treatment or other patient management decisions. A negative result may occur with  improper specimen collection/handling, submission of specimen other than nasopharyngeal swab, presence of viral mutation(s) within the areas targeted by this assay, and inadequate number of viral copies(<138 copies/mL). A negative result must be combined with clinical observations, patient history, and epidemiological information. The expected result is Negative.  Fact Sheet for Patients:  bloggercourse.com  Fact Sheet for Healthcare Providers:  seriousbroker.it  This test is no t yet approved or cleared by the [place]  FDA and  has been  authorized for detection and/or diagnosis of SARS-CoV-2 by FDA under an Emergency Use Authorization (EUA). This EUA will remain  in effect (meaning this test can be used) for the duration of the COVID-19 declaration under Section 564(b)(1) of the Act, 21 U.S.C.section 360bbb-3(b)(1), unless the authorization is terminated  or revoked sooner.       Influenza A by PCR NEGATIVE NEGATIVE Final   Influenza B by PCR NEGATIVE NEGATIVE Final    Comment: (NOTE) The Xpert Xpress SARS-CoV-2/FLU/RSV plus assay is  intended as an aid in the diagnosis of influenza from Nasopharyngeal swab specimens and should not be used as a sole basis for treatment. Nasal washings and aspirates are unacceptable for Xpert Xpress SARS-CoV-2/FLU/RSV testing.  Fact Sheet for Patients: bloggercourse.com  Fact Sheet for Healthcare Providers: seriousbroker.it  This test is not yet approved or cleared by the [place]  FDA and has been authorized for detection and/or diagnosis of SARS-CoV-2 by FDA under an Emergency Use Authorization (EUA). This EUA will remain in effect (meaning this test can be used) for the duration of the COVID-19 declaration under Section 564(b)(1) of the Act, 21 U.S.C. section 360bbb-3(b)(1), unless the authorization is terminated or revoked.     Resp Syncytial Virus by PCR NEGATIVE NEGATIVE Final    Comment: (NOTE) Fact Sheet for Patients: bloggercourse.com  Fact Sheet for Healthcare Providers: seriousbroker.it  This test is not yet approved or cleared by the [place]  FDA and has been authorized for detection and/or diagnosis of SARS-CoV-2 by FDA under an Emergency Use Authorization (EUA). This EUA will remain in effect (meaning this test can be used) for the duration of the COVID-19 declaration under Section 564(b)(1) of the Act, 21 U.S.C. section 360bbb-3(b)(1), unless the  authorization is terminated or revoked.  Performed at [facility], [address] [place], [place] [address]      Time coordinating discharge: 35 minutes  SIGNED:   Anthony CHRISTELLA Pouch, MD  [facility] 06/11/2024, 11:26 AM Pager   If 7PM-7AM, please contact night-coverage [email or web address]

## 2024-06-11 NOTE — Progress Notes (Signed)
 Patient has been discharged to home with spouse at bedside.  Alert and oriented x4 Ambulatory at will with caution.Discharge instructions have been given to patient with understanding.  Left arm IV has been removed. Catheter and tip intact. Site covered with gauze and tape  Staff to transport patient to lobby for pick up.

## 2024-06-11 NOTE — Plan of Care (Signed)
  Problem: Nutrition: Goal: Adequate nutrition will be maintained Outcome: Progressing   Problem: Elimination: Goal: Will not experience complications related to bowel motility Outcome: Progressing   Problem: Education: Goal: Knowledge of General Education information will improve Description: Including pain rating scale, medication(s)/side effects and non-pharmacologic comfort measures Outcome: Progressing

## 2024-06-12 ENCOUNTER — Other Ambulatory Visit: Payer: Self-pay | Admitting: Obstetrics and Gynecology

## 2024-06-12 LAB — URINE CULTURE: Culture: >=100000 — AB

## 2024-08-18 ENCOUNTER — Other Ambulatory Visit: Payer: Self-pay | Admitting: Internal Medicine

## 2024-08-18 DIAGNOSIS — K760 Fatty (change of) liver, not elsewhere classified: Secondary | ICD-10-CM

## 2024-08-18 DIAGNOSIS — R748 Abnormal levels of other serum enzymes: Secondary | ICD-10-CM

## 2024-08-30 ENCOUNTER — Ambulatory Visit
Admission: RE | Admit: 2024-08-30 | Discharge: 2024-08-30 | Disposition: A | Source: Ambulatory Visit | Attending: Internal Medicine | Admitting: Internal Medicine

## 2024-08-30 DIAGNOSIS — R748 Abnormal levels of other serum enzymes: Secondary | ICD-10-CM

## 2024-08-30 DIAGNOSIS — K760 Fatty (change of) liver, not elsewhere classified: Secondary | ICD-10-CM

## 2025-01-05 ENCOUNTER — Ambulatory Visit: Admitting: Family Medicine

## 2025-11-15 ENCOUNTER — Ambulatory Visit: Admitting: Dermatology
